# Patient Record
Sex: Male | Born: 1960 | Race: White | Hispanic: No | Smoking: Current every day smoker
Health system: Southern US, Community
[De-identification: ages and names within clinical notes are randomized; demographics above are authoritative.]

## PROBLEM LIST (undated history)

## (undated) DIAGNOSIS — F4310 Post-traumatic stress disorder, unspecified: Secondary | ICD-10-CM

## (undated) DIAGNOSIS — M542 Cervicalgia: Secondary | ICD-10-CM

## (undated) DIAGNOSIS — G43909 Migraine, unspecified, not intractable, without status migrainosus: Secondary | ICD-10-CM

## (undated) DIAGNOSIS — G8929 Other chronic pain: Secondary | ICD-10-CM

## (undated) DIAGNOSIS — K219 Gastro-esophageal reflux disease without esophagitis: Secondary | ICD-10-CM

## (undated) DIAGNOSIS — F419 Anxiety disorder, unspecified: Secondary | ICD-10-CM

## (undated) DIAGNOSIS — M545 Low back pain, unspecified: Secondary | ICD-10-CM

## (undated) DIAGNOSIS — K259 Gastric ulcer, unspecified as acute or chronic, without hemorrhage or perforation: Secondary | ICD-10-CM

## (undated) DIAGNOSIS — M1990 Unspecified osteoarthritis, unspecified site: Secondary | ICD-10-CM

## (undated) DIAGNOSIS — F329 Major depressive disorder, single episode, unspecified: Secondary | ICD-10-CM

## (undated) DIAGNOSIS — F32A Depression, unspecified: Secondary | ICD-10-CM

## (undated) HISTORY — PX: ESOPHAGOGASTRODUODENOSCOPY: SHX1529

## (undated) HISTORY — PX: JOINT REPLACEMENT: SHX530

## (undated) HISTORY — PX: COLONOSCOPY: SHX174

## (undated) HISTORY — PX: KNEE ARTHROSCOPY: SUR90

---
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
## 2000-01-06 HISTORY — PX: SHOULDER ARTHROSCOPY: SHX128

## 2000-01-06 HISTORY — PX: CLAVICLE SURGERY: SHX598

## 2005-02-03 ENCOUNTER — Encounter
Admission: RE | Admit: 2005-02-03 | Discharge: 2005-05-04 | Payer: Self-pay | Admitting: Physical Medicine & Rehabilitation

## 2005-02-03 ENCOUNTER — Ambulatory Visit: Payer: Self-pay | Admitting: Physical Medicine & Rehabilitation

## 2006-01-05 HISTORY — PX: ANKLE FUSION: SHX881

## 2010-01-05 HISTORY — PX: ANKLE HARDWARE REMOVAL: SHX1149

## 2015-11-14 ENCOUNTER — Ambulatory Visit: Payer: Self-pay | Admitting: Orthopedic Surgery

## 2015-12-11 NOTE — Pre-Procedure Instructions (Signed)
    De NurseLawrence Teigen  12/11/2015     No Pharmacies Listed   Your procedure is scheduled on December 18  [facility]  Call this number if you have problems the morning of surgery:  [phone]   Remember:  Do not eat food or drink liquids after midnight.   Take these medicines the morning of surgery with A SIP OF WATER cetirizine (ZYRTEC),gabapentin (NEURONTIN,  HYDROcodone-acetaminophen (NORCO/VICODIN,methocarbamol (ROBAXIN),  escitalopram (LEXAPRO)  7 days prior to surgery STOP taking any Aspirin, Aleve, Naproxen, Ibuprofen, Motrin, Advil, Goody's, BC's, all herbal medications, fish oil, and all vitamins    Do not wear jewelry.  Do not wear lotions, powders, or cologne, or deoderant.  Men may shave face and neck.  Do not bring valuables to the hospital.  [facility] is not [facility] for any belongings or valuables.  Contacts, dentures or bridgework may not be worn into surgery.  Leave your suitcase in the car.  After surgery it may be brought to your room.  For patients admitted to the hospital, discharge time will be determined by your treatment team.  Patients discharged the day of surgery will not be allowed to drive home.    Special instructions:   [facility]- Preparing For Surgery  Before surgery, you can play an important role. Because skin is not sterile, your skin needs to be as free of germs as possible. You can reduce the number of germs on your skin by washing with CHG (chlorahexidine gluconate) Soap before surgery.  CHG is an antiseptic cleaner which kills germs and bonds with the skin to continue killing germs even after washing.  Please do not use if you have an allergy to CHG or antibacterial soaps. If your skin becomes reddened/irritated stop using the CHG.  Do not shave (including legs and underarms) for at least 48 hours prior to first CHG shower. It is OK to shave your face.  Please follow these instructions  carefully.   1. Shower the NIGHT BEFORE SURGERY and the MORNING OF SURGERY with CHG.   2. If you chose to wash your hair, wash your hair first as usual with your normal shampoo.  3. After you shampoo, rinse your hair and body thoroughly to remove the shampoo.  4. Use CHG as you would any other liquid soap. You can apply CHG directly to the skin and wash gently with a scrungie or a clean washcloth.   5. Apply the CHG Soap to your body ONLY FROM THE NECK DOWN.  Do not use on open wounds or open sores. Avoid contact with your eyes, ears, mouth and genitals (private parts). Wash genitals (private parts) with your normal soap.  6. Wash thoroughly, paying special attention to the area where your surgery will be performed.  7. Thoroughly rinse your body with warm water from the neck down.  8. DO NOT shower/wash with your normal soap after using and rinsing off the CHG Soap.  9. Pat yourself dry with a CLEAN TOWEL.   10. Wear CLEAN PAJAMAS   11. Place CLEAN SHEETS on your bed the night of your first shower and DO NOT SLEEP WITH PETS.    Day of Surgery: Do not apply any deodorants/lotions. Please wear clean clothes to the hospital/surgery center.      Please read over the following fact sheets that you were given.

## 2015-12-12 ENCOUNTER — Encounter (HOSPITAL_COMMUNITY)
Admission: RE | Admit: 2015-12-12 | Discharge: 2015-12-12 | Disposition: A | Discharge status: Home / Self Care | Payer: No Typology Code available for payment source | Source: Ambulatory Visit | Attending: Orthopedic Surgery | Admitting: Orthopedic Surgery

## 2015-12-12 ENCOUNTER — Encounter (HOSPITAL_COMMUNITY): Payer: Self-pay

## 2015-12-12 DIAGNOSIS — Z01812 Encounter for preprocedural laboratory examination: Secondary | ICD-10-CM | POA: Insufficient documentation

## 2015-12-12 HISTORY — DX: Major depressive disorder, single episode, unspecified: F32.9

## 2015-12-12 HISTORY — DX: Anxiety disorder, unspecified: F41.9

## 2015-12-12 HISTORY — DX: Unspecified osteoarthritis, unspecified site: M19.90

## 2015-12-12 HISTORY — DX: Post-traumatic stress disorder, unspecified: F43.10

## 2015-12-12 HISTORY — DX: Depression, unspecified: F32.A

## 2015-12-12 HISTORY — DX: Gastro-esophageal reflux disease without esophagitis: K21.9

## 2015-12-12 LAB — SURGICAL PCR SCREEN
MRSA, PCR: NEGATIVE
STAPHYLOCOCCUS AUREUS: NEGATIVE

## 2015-12-12 LAB — CBC
HEMATOCRIT: 43.9 % (ref 39.0–52.0)
Hemoglobin: 15.6 g/dL (ref 13.0–17.0)
MCH: 30.7 pg (ref 26.0–34.0)
MCHC: 35.5 g/dL (ref 30.0–36.0)
MCV: 86.4 fL (ref 78.0–100.0)
PLATELETS: 185 10*3/uL (ref 150–400)
RBC: 5.08 MIL/uL (ref 4.22–5.81)
RDW: 13.2 % (ref 11.5–15.5)
WBC: 7.4 10*3/uL (ref 4.0–10.5)

## 2015-12-12 LAB — TYPE AND SCREEN
ABO/RH(D): A NEG
ANTIBODY SCREEN: NEGATIVE

## 2015-12-12 LAB — BASIC METABOLIC PANEL
Anion gap: 8 (ref 5–15)
BUN: 7 mg/dL (ref 6–20)
CO2: 24 mmol/L (ref 22–32)
CREATININE: 0.96 mg/dL (ref 0.61–1.24)
Calcium: 8.9 mg/dL (ref 8.9–10.3)
Chloride: 104 mmol/L (ref 101–111)
GFR calc Af Amer: >60 mL/min (ref >60)
GLUCOSE: 108 mg/dL — AB (ref 65–99)
POTASSIUM: 3.9 mmol/L (ref 3.5–5.1)
SODIUM: 136 mmol/L (ref 135–145)

## 2015-12-12 LAB — ABO/RH: ABO/RH(D): A NEG

## 2015-12-12 NOTE — Progress Notes (Signed)
PCP - [facility] [place]irt Bachmann [place] [facility] [facility] - denies  Chest x-ray - not needed EKG - denies Stress Test -denies  ECHO - denies Cardiac Cath - denies  No cardiac History. Requesting last office visit from PCP  will send to anesthesia for review of records from PCP    Patient denies shortness of breath, fever, cough and chest pain at PAT appointment

## 2015-12-18 ENCOUNTER — Ambulatory Visit: Payer: Self-pay | Admitting: Orthopedic Surgery

## 2015-12-18 NOTE — H&P (Signed)
TOTAL HIP ADMISSION H&P  Patient is admitted for left total hip arthroplasty.  Subjective:  Chief Complaint: left hip pain  HPI: Zachary Watson, 55 y.o. male, has a history of pain and functional disability in the left hip(s) due to arthritis and patient has failed non-surgical conservative treatments for greater than 12 weeks to include NSAID's and/or analgesics, flexibility and strengthening excercises, use of assistive devices, weight reduction as appropriate and activity modification.  Onset of symptoms was gradual starting 5 years ago with gradually worsening course since that time.The patient noted no past surgery on the left hip(s).  Patient currently rates pain in the left hip at 10 out of 10 with activity. Patient has night pain, worsening of pain with activity and weight bearing, pain that interfers with activities of daily living, pain with passive range of motion and crepitus. Patient has evidence of subchondral cysts, subchondral sclerosis, periarticular osteophytes and joint space narrowing by imaging studies. This condition presents safety issues increasing the risk of falls. There is no current active infection.  There are no active problems to display for this patient.  Past Medical History:  Diagnosis Date  . Anxiety   . Arthritis   . Chronic back pain   . Depression   . GERD (gastroesophageal reflux disease)   . PTSD (post-traumatic stress disorder)     Past Surgical History:  Procedure Laterality Date  . ANKLE SURGERY Right    fusion  . CLAVICLE SURGERY Left   . COLONOSCOPY    . ESOPHAGOGASTRODUODENOSCOPY    . KNEE ARTHROSCOPY Left   . SHOULDER ARTHROSCOPY Left   . WRIST SURGERY Right      (Not in a hospital admission) No Known Allergies  Social History  Substance Use Topics  . Smoking status: Former Smoker    Quit date: 12/02/2015  . Smokeless tobacco: Former User    Types: Chew  . Alcohol use Yes     Comment: occasionally    No family history on  file.   Review of Systems  Constitutional: Negative.   Eyes: Negative.   Respiratory: Negative.   Cardiovascular: Negative.   Gastrointestinal: Positive for blood in stool and diarrhea.  Musculoskeletal: Positive for back pain and joint pain.  Skin: Positive for itching.  Neurological: Negative.   Endo/Heme/Allergies: Negative.   Psychiatric/Behavioral: Positive for memory loss. The patient has insomnia.     Objective:  Physical Exam  Vitals reviewed. Constitutional: He is oriented to person, place, and time. He appears well-developed and well-nourished.  HENT:  Head: Normocephalic and atraumatic.  Eyes: Conjunctivae and EOM are normal. Pupils are equal, round, and reactive to light.  Neck: Normal range of motion. Neck supple.  Cardiovascular: Normal rate, regular rhythm and intact distal pulses.   Respiratory: Effort normal. No respiratory distress.  GI: Soft. He exhibits no distension.  Genitourinary:  Genitourinary Comments: deferred  Musculoskeletal:       Left hip: He exhibits decreased range of motion and bony tenderness.  Neurological: He is alert and oriented to person, place, and time. He has normal reflexes.  Skin: Skin is warm and dry.  Psychiatric: He has a normal mood and affect. His behavior is normal. Judgment and thought content normal.    Vital signs in last 24 hours: @VSRANGES@  Labs:   Estimated body mass index is 27.12 kg/m as calculated from the following:   Height as of 12/12/15: 5' 11" (1.803 m).   Weight as of 12/12/15: 88.2 kg (194 lb 7.1 oz).     Imaging Review Plain radiographs demonstrate severe degenerative joint disease of the left hip(s). The bone quality appears to be adequate for age and reported activity level.  Assessment/Plan:  End stage arthritis, left hip(s)  The patient history, physical examination, clinical judgement of the provider and imaging studies are consistent with end stage degenerative joint disease of the left  hip(s) and total hip arthroplasty is deemed medically necessary. The treatment options including medical management, injection therapy, arthroscopy and arthroplasty were discussed at length. The risks and benefits of total hip arthroplasty were presented and reviewed. The risks due to aseptic loosening, infection, stiffness, dislocation/subluxation,  thromboembolic complications and other imponderables were discussed.  The patient acknowledged the explanation, agreed to proceed with the plan and consent was signed. Patient is being admitted for inpatient treatment for surgery, pain control, PT, OT, prophylactic antibiotics, VTE prophylaxis, progressive ambulation and ADL's and discharge planning.The patient is planning to be discharged home with home health services

## 2015-12-20 MED ORDER — TRANEXAMIC ACID 1000 MG/10ML IV SOLN
1000.0000 mg | INTRAVENOUS | Status: AC
  Administered 2015-12-23: 1000 mg via INTRAVENOUS
  Filled 2015-12-20: qty 10

## 2015-12-20 MED ORDER — ACETAMINOPHEN 10 MG/ML IV SOLN
1000.0000 mg | INTRAVENOUS | Status: AC
  Administered 2015-12-23: 1000 mg via INTRAVENOUS
  Filled 2015-12-20: qty 100

## 2015-12-23 ENCOUNTER — Inpatient Hospital Stay (HOSPITAL_COMMUNITY): Payer: Non-veteran care | Admitting: Emergency Medicine

## 2015-12-23 ENCOUNTER — Inpatient Hospital Stay (HOSPITAL_COMMUNITY)
Admission: RE | Admit: 2015-12-23 | Discharge: 2015-12-24 | DRG: 470 | Disposition: A | Discharge status: Home Health | Payer: Non-veteran care | Source: Ambulatory Visit | Attending: Orthopedic Surgery | Admitting: Orthopedic Surgery

## 2015-12-23 ENCOUNTER — Encounter (HOSPITAL_COMMUNITY)
Admission: RE | Disposition: A | Discharge status: Home Health | Payer: Self-pay | Source: Ambulatory Visit | Attending: Orthopedic Surgery

## 2015-12-23 ENCOUNTER — Inpatient Hospital Stay (HOSPITAL_COMMUNITY): Payer: Non-veteran care | Admitting: Certified Registered Nurse Anesthetist

## 2015-12-23 ENCOUNTER — Inpatient Hospital Stay (HOSPITAL_COMMUNITY): Payer: Non-veteran care

## 2015-12-23 ENCOUNTER — Encounter (HOSPITAL_COMMUNITY): Payer: Self-pay | Admitting: *Deleted

## 2015-12-23 DIAGNOSIS — M1612 Unilateral primary osteoarthritis, left hip: Secondary | ICD-10-CM | POA: Diagnosis present

## 2015-12-23 DIAGNOSIS — K219 Gastro-esophageal reflux disease without esophagitis: Secondary | ICD-10-CM | POA: Diagnosis present

## 2015-12-23 DIAGNOSIS — Z09 Encounter for follow-up examination after completed treatment for conditions other than malignant neoplasm: Secondary | ICD-10-CM

## 2015-12-23 DIAGNOSIS — G4700 Insomnia, unspecified: Secondary | ICD-10-CM | POA: Diagnosis present

## 2015-12-23 DIAGNOSIS — R262 Difficulty in walking, not elsewhere classified: Secondary | ICD-10-CM

## 2015-12-23 DIAGNOSIS — F4310 Post-traumatic stress disorder, unspecified: Secondary | ICD-10-CM | POA: Diagnosis present

## 2015-12-23 DIAGNOSIS — M25552 Pain in left hip: Secondary | ICD-10-CM | POA: Diagnosis present

## 2015-12-23 DIAGNOSIS — Z87891 Personal history of nicotine dependence: Secondary | ICD-10-CM | POA: Diagnosis not present

## 2015-12-23 DIAGNOSIS — I959 Hypotension, unspecified: Secondary | ICD-10-CM | POA: Diagnosis present

## 2015-12-23 DIAGNOSIS — Z419 Encounter for procedure for purposes other than remedying health state, unspecified: Secondary | ICD-10-CM

## 2015-12-23 DIAGNOSIS — M79605 Pain in left leg: Secondary | ICD-10-CM

## 2015-12-23 HISTORY — PX: TOTAL HIP ARTHROPLASTY: SHX124

## 2015-12-23 SURGERY — ARTHROPLASTY, HIP, TOTAL, ANTERIOR APPROACH
Anesthesia: Monitor Anesthesia Care | Site: Hip | Laterality: Left

## 2015-12-23 MED ORDER — HYDROMORPHONE HCL 2 MG/ML IJ SOLN: 0.5000 mg | INTRAMUSCULAR | Status: DC | PRN

## 2015-12-23 MED ORDER — PROMETHAZINE HCL 25 MG/ML IJ SOLN: 6.2500 mg | INTRAMUSCULAR | Status: DC | PRN

## 2015-12-23 MED ORDER — DIPHENHYDRAMINE HCL 12.5 MG/5ML PO ELIX: 12.5000 mg | ORAL_SOLUTION | ORAL | Status: DC | PRN

## 2015-12-23 MED ORDER — SUGAMMADEX SODIUM 200 MG/2ML IV SOLN
INTRAVENOUS | Status: AC
  Filled 2015-12-23: qty 2

## 2015-12-23 MED ORDER — PHENOL 1.4 % MT LIQD: 1.0000 | OROMUCOSAL | Status: DC | PRN

## 2015-12-23 MED ORDER — KETOROLAC TROMETHAMINE 30 MG/ML IJ SOLN
INTRAMUSCULAR | Status: DC | PRN
  Administered 2015-12-23: 30 mg via INTRA_ARTICULAR

## 2015-12-23 MED ORDER — POVIDONE-IODINE 10 % EX SWAB: 2.0000 "application " | Freq: Once | CUTANEOUS | Status: DC

## 2015-12-23 MED ORDER — ONDANSETRON HCL 4 MG/2ML IJ SOLN: 4.0000 mg | Freq: Four times a day (QID) | INTRAMUSCULAR | Status: DC | PRN

## 2015-12-23 MED ORDER — SODIUM CHLORIDE 0.9 % IV SOLN: INTRAVENOUS | Status: DC

## 2015-12-23 MED ORDER — ROCURONIUM BROMIDE 50 MG/5ML IV SOSY
PREFILLED_SYRINGE | INTRAVENOUS | Status: AC
  Filled 2015-12-23: qty 10

## 2015-12-23 MED ORDER — CEFAZOLIN SODIUM-DEXTROSE 2-4 GM/100ML-% IV SOLN
2.0000 g | INTRAVENOUS | Status: AC
  Administered 2015-12-23: 2 g via INTRAVENOUS
  Filled 2015-12-23: qty 100

## 2015-12-23 MED ORDER — BUPIVACAINE HCL (PF) 0.5 % IJ SOLN
INTRAMUSCULAR | Status: DC | PRN
  Administered 2015-12-23: 25 mL

## 2015-12-23 MED ORDER — ONDANSETRON HCL 4 MG/2ML IJ SOLN
INTRAMUSCULAR | Status: DC | PRN
  Administered 2015-12-23: 4 mg via INTRAVENOUS

## 2015-12-23 MED ORDER — METOCLOPRAMIDE HCL 5 MG PO TABS: 5.0000 mg | ORAL_TABLET | Freq: Three times a day (TID) | ORAL | Status: DC | PRN

## 2015-12-23 MED ORDER — PHENYLEPHRINE 40 MCG/ML (10ML) SYRINGE FOR IV PUSH (FOR BLOOD PRESSURE SUPPORT)
PREFILLED_SYRINGE | INTRAVENOUS | Status: AC
  Filled 2015-12-23: qty 10

## 2015-12-23 MED ORDER — PROPOFOL 10 MG/ML IV BOLUS
INTRAVENOUS | Status: AC
  Filled 2015-12-23: qty 20

## 2015-12-23 MED ORDER — PROPOFOL 500 MG/50ML IV EMUL
INTRAVENOUS | Status: DC | PRN
  Administered 2015-12-23: 100 ug/kg/min via INTRAVENOUS

## 2015-12-23 MED ORDER — METOCLOPRAMIDE HCL 5 MG/ML IJ SOLN: 5.0000 mg | Freq: Three times a day (TID) | INTRAMUSCULAR | Status: DC | PRN

## 2015-12-23 MED ORDER — ASPIRIN 81 MG PO CHEW
81.0000 mg | CHEWABLE_TABLET | Freq: Two times a day (BID) | ORAL | Status: DC
  Administered 2015-12-23 – 2015-12-24 (×2): 81 mg via ORAL
  Filled 2015-12-23 (×2): qty 1

## 2015-12-23 MED ORDER — LACTATED RINGERS IV SOLN: INTRAVENOUS | Status: DC

## 2015-12-23 MED ORDER — LACTATED RINGERS IV SOLN
INTRAVENOUS | Status: DC | PRN
Start: 2015-12-23 — End: 2015-12-23
  Administered 2015-12-23 (×2): via INTRAVENOUS

## 2015-12-23 MED ORDER — FENTANYL CITRATE (PF) 100 MCG/2ML IJ SOLN
INTRAMUSCULAR | Status: DC | PRN
  Administered 2015-12-23 (×4): 25 ug via INTRAVENOUS

## 2015-12-23 MED ORDER — METHOCARBAMOL 1000 MG/10ML IJ SOLN
500.0000 mg | Freq: Four times a day (QID) | INTRAMUSCULAR | Status: DC | PRN
  Filled 2015-12-23: qty 5

## 2015-12-23 MED ORDER — CEFAZOLIN SODIUM-DEXTROSE 2-4 GM/100ML-% IV SOLN
2.0000 g | Freq: Four times a day (QID) | INTRAVENOUS | Status: DC
  Filled 2015-12-23 (×2): qty 100

## 2015-12-23 MED ORDER — ONDANSETRON HCL 4 MG PO TABS: 4.0000 mg | ORAL_TABLET | Freq: Four times a day (QID) | ORAL | Status: DC | PRN

## 2015-12-23 MED ORDER — ONDANSETRON HCL 4 MG/2ML IJ SOLN
INTRAMUSCULAR | Status: AC
  Filled 2015-12-23: qty 2

## 2015-12-23 MED ORDER — SODIUM CHLORIDE 0.9 % IV SOLN
1000.0000 mg | Freq: Once | INTRAVENOUS | Status: DC
  Filled 2015-12-23 (×2): qty 10

## 2015-12-23 MED ORDER — METHOCARBAMOL 500 MG PO TABS
500.0000 mg | ORAL_TABLET | Freq: Four times a day (QID) | ORAL | Status: DC | PRN
  Administered 2015-12-24 (×2): 500 mg via ORAL
  Filled 2015-12-23 (×2): qty 1

## 2015-12-23 MED ORDER — HYDROCODONE-ACETAMINOPHEN 7.5-325 MG PO TABS
1.0000 | ORAL_TABLET | ORAL | Status: DC | PRN
  Administered 2015-12-23 – 2015-12-24 (×3): 2 via ORAL
  Filled 2015-12-23 (×3): qty 2

## 2015-12-23 MED ORDER — DOCUSATE SODIUM 100 MG PO CAPS
100.0000 mg | ORAL_CAPSULE | Freq: Two times a day (BID) | ORAL | Status: DC
  Administered 2015-12-23 – 2015-12-24 (×2): 100 mg via ORAL
  Filled 2015-12-23 (×2): qty 1

## 2015-12-23 MED ORDER — SODIUM CHLORIDE 0.9 % IJ SOLN
INTRAMUSCULAR | Status: DC | PRN
  Administered 2015-12-23: 25 mL

## 2015-12-23 MED ORDER — PROPOFOL 10 MG/ML IV BOLUS
INTRAVENOUS | Status: DC | PRN
  Administered 2015-12-23: 30 mg via INTRAVENOUS
  Administered 2015-12-23: 20 mg via INTRAVENOUS

## 2015-12-23 MED ORDER — FENTANYL CITRATE (PF) 100 MCG/2ML IJ SOLN
INTRAMUSCULAR | Status: AC
  Filled 2015-12-23: qty 2

## 2015-12-23 MED ORDER — BUPIVACAINE HCL (PF) 0.5 % IJ SOLN
INTRAMUSCULAR | Status: AC
  Filled 2015-12-23: qty 30

## 2015-12-23 MED ORDER — KETOROLAC TROMETHAMINE 30 MG/ML IJ SOLN
INTRAMUSCULAR | Status: AC
  Filled 2015-12-23: qty 1

## 2015-12-23 MED ORDER — POLYETHYLENE GLYCOL 3350 17 G PO PACK: 17.0000 g | PACK | Freq: Every day | ORAL | Status: DC | PRN

## 2015-12-23 MED ORDER — EPHEDRINE 5 MG/ML INJ
INTRAVENOUS | Status: AC
  Filled 2015-12-23: qty 10

## 2015-12-23 MED ORDER — 0.9 % SODIUM CHLORIDE (POUR BTL) OPTIME
TOPICAL | Status: DC | PRN
  Administered 2015-12-23: 1000 mL

## 2015-12-23 MED ORDER — PROPOFOL 1000 MG/100ML IV EMUL
INTRAVENOUS | Status: AC
  Filled 2015-12-23: qty 200

## 2015-12-23 MED ORDER — ACETAMINOPHEN 325 MG PO TABS: 650.0000 mg | ORAL_TABLET | Freq: Four times a day (QID) | ORAL | Status: DC | PRN

## 2015-12-23 MED ORDER — ALUM & MAG HYDROXIDE-SIMETH 200-200-20 MG/5ML PO SUSP: 30.0000 mL | ORAL | Status: DC | PRN

## 2015-12-23 MED ORDER — GABAPENTIN 300 MG PO CAPS
300.0000 mg | ORAL_CAPSULE | Freq: Two times a day (BID) | ORAL | Status: DC
  Administered 2015-12-23 – 2015-12-24 (×2): 300 mg via ORAL
  Filled 2015-12-23 (×2): qty 1

## 2015-12-23 MED ORDER — SODIUM CHLORIDE 0.9 % IV SOLN
INTRAVENOUS | Status: DC
  Administered 2015-12-24: 01:00:00 via INTRAVENOUS

## 2015-12-23 MED ORDER — LIDOCAINE 2% (20 MG/ML) 5 ML SYRINGE
INTRAMUSCULAR | Status: AC
  Filled 2015-12-23: qty 5

## 2015-12-23 MED ORDER — MEPERIDINE HCL 25 MG/ML IJ SOLN: 6.2500 mg | INTRAMUSCULAR | Status: DC | PRN

## 2015-12-23 MED ORDER — ROCURONIUM BROMIDE 50 MG/5ML IV SOSY
PREFILLED_SYRINGE | INTRAVENOUS | Status: AC
  Filled 2015-12-23: qty 5

## 2015-12-23 MED ORDER — KETOROLAC TROMETHAMINE 15 MG/ML IJ SOLN
15.0000 mg | Freq: Four times a day (QID) | INTRAMUSCULAR | Status: DC
  Administered 2015-12-23 – 2015-12-24 (×3): 15 mg via INTRAVENOUS
  Filled 2015-12-23 (×4): qty 1

## 2015-12-23 MED ORDER — LORATADINE 10 MG PO TABS
10.0000 mg | ORAL_TABLET | Freq: Every day | ORAL | Status: DC
  Administered 2015-12-24: 10 mg via ORAL
  Filled 2015-12-23: qty 1

## 2015-12-23 MED ORDER — CHLORHEXIDINE GLUCONATE 4 % EX LIQD: 60.0000 mL | Freq: Once | CUTANEOUS | Status: DC

## 2015-12-23 MED ORDER — MIDAZOLAM HCL 5 MG/5ML IJ SOLN
INTRAMUSCULAR | Status: DC | PRN
  Administered 2015-12-23: 2 mg via INTRAVENOUS

## 2015-12-23 MED ORDER — MIDAZOLAM HCL 2 MG/2ML IJ SOLN
INTRAMUSCULAR | Status: AC
  Filled 2015-12-23: qty 2

## 2015-12-23 MED ORDER — ACETAMINOPHEN 650 MG RE SUPP: 650.0000 mg | Freq: Four times a day (QID) | RECTAL | Status: DC | PRN

## 2015-12-23 MED ORDER — MENTHOL 3 MG MT LOZG: 1.0000 | LOZENGE | OROMUCOSAL | Status: DC | PRN

## 2015-12-23 MED ORDER — PHENYLEPHRINE 40 MCG/ML (10ML) SYRINGE FOR IV PUSH (FOR BLOOD PRESSURE SUPPORT)
PREFILLED_SYRINGE | INTRAVENOUS | Status: AC
  Filled 2015-12-23: qty 20

## 2015-12-23 MED ORDER — DEXAMETHASONE SODIUM PHOSPHATE 10 MG/ML IJ SOLN
INTRAMUSCULAR | Status: DC | PRN
  Administered 2015-12-23: 10 mg via INTRAVENOUS

## 2015-12-23 MED ORDER — CEFAZOLIN SODIUM-DEXTROSE 2-4 GM/100ML-% IV SOLN
2.0000 g | Freq: Four times a day (QID) | INTRAVENOUS | Status: AC
  Administered 2015-12-23: 2 g via INTRAVENOUS
  Filled 2015-12-23: qty 100

## 2015-12-23 MED ORDER — SODIUM CHLORIDE 0.9 % IR SOLN
Status: DC | PRN
  Administered 2015-12-23: 3000 mL

## 2015-12-23 MED ORDER — PHENYLEPHRINE HCL 10 MG/ML IJ SOLN
INTRAVENOUS | Status: DC | PRN
  Administered 2015-12-23: 20 ug/min via INTRAVENOUS

## 2015-12-23 MED ORDER — HYDROMORPHONE HCL 1 MG/ML IJ SOLN: 0.2500 mg | INTRAMUSCULAR | Status: DC | PRN

## 2015-12-23 MED ORDER — SENNA 8.6 MG PO TABS
2.0000 | ORAL_TABLET | Freq: Every day | ORAL | Status: DC
  Administered 2015-12-23: 17.2 mg via ORAL
  Filled 2015-12-23: qty 2

## 2015-12-23 MED ORDER — PHENYLEPHRINE 40 MCG/ML (10ML) SYRINGE FOR IV PUSH (FOR BLOOD PRESSURE SUPPORT)
PREFILLED_SYRINGE | INTRAVENOUS | Status: DC | PRN
  Administered 2015-12-23 (×2): 40 ug via INTRAVENOUS
  Administered 2015-12-23: 80 ug via INTRAVENOUS

## 2015-12-23 MED ORDER — DEXAMETHASONE SODIUM PHOSPHATE 10 MG/ML IJ SOLN
10.0000 mg | Freq: Once | INTRAMUSCULAR | Status: AC
  Administered 2015-12-24: 10 mg via INTRAVENOUS
  Filled 2015-12-23: qty 1

## 2015-12-23 MED ORDER — ESCITALOPRAM OXALATE 10 MG PO TABS
20.0000 mg | ORAL_TABLET | Freq: Every day | ORAL | Status: DC
  Administered 2015-12-24: 20 mg via ORAL
  Filled 2015-12-23: qty 2

## 2015-12-23 SURGICAL SUPPLY — 57 items
ADH SKN CLS APL DERMABOND .7 (GAUZE/BANDAGES/DRESSINGS) ×2
ALCOHOL ISOPROPYL (RUBBING) (MISCELLANEOUS) ×3 IMPLANT
BLADE SURG ROTATE 9660 (MISCELLANEOUS) ×2 IMPLANT
CAPT HIP TOTAL 2 ×2 IMPLANT
CHLORAPREP W/TINT 26ML (MISCELLANEOUS) ×3 IMPLANT
COVER SURGICAL LIGHT HANDLE (MISCELLANEOUS) ×3 IMPLANT
DECANTER SPIKE VIAL GLASS SM (MISCELLANEOUS) ×2 IMPLANT
DERMABOND ADVANCED (GAUZE/BANDAGES/DRESSINGS) ×4
DERMABOND ADVANCED .7 DNX12 (GAUZE/BANDAGES/DRESSINGS) ×2 IMPLANT
DRAPE C-ARM 42X72 X-RAY (DRAPES) ×3 IMPLANT
DRAPE IMP U-DRAPE 54X76 (DRAPES) ×6 IMPLANT
DRAPE STERI IOBAN 125X83 (DRAPES) ×3 IMPLANT
DRAPE U-SHAPE 47X51 STRL (DRAPES) ×9 IMPLANT
DRSG AQUACEL AG ADV 3.5X10 (GAUZE/BANDAGES/DRESSINGS) ×3 IMPLANT
ELECT BLADE 4.0 EZ CLEAN MEGAD (MISCELLANEOUS) ×3
ELECT REM PT RETURN 9FT ADLT (ELECTROSURGICAL) ×3
ELECTRODE BLDE 4.0 EZ CLN MEGD (MISCELLANEOUS) ×1 IMPLANT
ELECTRODE REM PT RTRN 9FT ADLT (ELECTROSURGICAL) ×1 IMPLANT
EVACUATOR 1/8 PVC DRAIN (DRAIN) IMPLANT
GLOVE BIO SURGEON STRL SZ8.5 (GLOVE) ×6 IMPLANT
GLOVE BIOGEL PI IND STRL 8.5 (GLOVE) ×1 IMPLANT
GLOVE BIOGEL PI INDICATOR 8.5 (GLOVE) ×2
GOWN STRL REUS W/ TWL LRG LVL3 (GOWN DISPOSABLE) ×2 IMPLANT
GOWN STRL REUS W/TWL 2XL LVL3 (GOWN DISPOSABLE) ×3 IMPLANT
GOWN STRL REUS W/TWL LRG LVL3 (GOWN DISPOSABLE) ×6
HANDPIECE INTERPULSE COAX TIP (DISPOSABLE) ×3
HOOD PEEL AWAY FACE SHEILD DIS (HOOD) ×6 IMPLANT
KIT BASIN OR (CUSTOM PROCEDURE TRAY) ×3 IMPLANT
KIT ROOM TURNOVER OR (KITS) ×3 IMPLANT
MANIFOLD NEPTUNE II (INSTRUMENTS) ×3 IMPLANT
MARKER SKIN DUAL TIP RULER LAB (MISCELLANEOUS) ×6 IMPLANT
NEEDLE SPNL 18GX3.5 QUINCKE PK (NEEDLE) ×3 IMPLANT
NS IRRIG 1000ML POUR BTL (IV SOLUTION) ×3 IMPLANT
PACK TOTAL JOINT (CUSTOM PROCEDURE TRAY) ×3 IMPLANT
PACK UNIVERSAL I (CUSTOM PROCEDURE TRAY) ×3 IMPLANT
PAD ARMBOARD 7.5X6 YLW CONV (MISCELLANEOUS) ×6 IMPLANT
SAW OSC TIP CART 19.5X105X1.3 (SAW) ×3 IMPLANT
SEALER BIPOLAR AQUA 6.0 (INSTRUMENTS) ×2 IMPLANT
SET HNDPC FAN SPRY TIP SCT (DISPOSABLE) ×1 IMPLANT
SOLUTION BETADINE 4OZ (MISCELLANEOUS) ×3 IMPLANT
SUCTION FRAZIER HANDLE 10FR (MISCELLANEOUS) ×2
SUCTION TUBE FRAZIER 10FR DISP (MISCELLANEOUS) ×1 IMPLANT
SUT ETHIBOND NAB CT1 #1 30IN (SUTURE) ×6 IMPLANT
SUT MNCRL AB 3-0 PS2 18 (SUTURE) ×3 IMPLANT
SUT MON AB 2-0 CT1 36 (SUTURE) ×3 IMPLANT
SUT VIC AB 1 CT1 27 (SUTURE) ×3
SUT VIC AB 1 CT1 27XBRD ANBCTR (SUTURE) ×1 IMPLANT
SUT VIC AB 2-0 CT1 27 (SUTURE) ×3
SUT VIC AB 2-0 CT1 TAPERPNT 27 (SUTURE) ×1 IMPLANT
SUT VLOC 180 0 24IN GS25 (SUTURE) ×3 IMPLANT
SYR 50ML LL SCALE MARK (SYRINGE) ×3 IMPLANT
TOWEL OR 17X24 6PK STRL BLUE (TOWEL DISPOSABLE) ×3 IMPLANT
TOWEL OR 17X26 10 PK STRL BLUE (TOWEL DISPOSABLE) ×3 IMPLANT
TRAY CATH 16FR W/PLASTIC CATH (SET/KITS/TRAYS/PACK) ×2 IMPLANT
TRAY FOLEY CATH 16FR SILVER (SET/KITS/TRAYS/PACK) IMPLANT
WATER STERILE IRR 1000ML POUR (IV SOLUTION) ×3 IMPLANT
YANKAUER SUCT BULB TIP NO VENT (SUCTIONS) ×3 IMPLANT

## 2015-12-23 NOTE — Progress Notes (Signed)
Report given to lauren reynolds rn as caregiver 

## 2015-12-23 NOTE — Anesthesia Postprocedure Evaluation (Signed)
Anesthesia Post Note  Patient: Zachary Watson  Procedure(s) Performed: Procedure(s) (LRB): LEFT TOTAL HIP ARTHROPLASTY ANTERIOR APPROACH (Left)  Patient location during evaluation: PACU Anesthesia Type: Spinal and MAC Level of consciousness: awake and alert Pain management: pain level controlled Vital Signs Assessment: post-procedure vital signs reviewed and stable Respiratory status: spontaneous breathing and respiratory function stable Cardiovascular status: blood pressure returned to baseline and stable Postop Assessment: spinal receding Anesthetic complications: no       Last Vitals:  Vitals:   12/23/15 1600 12/23/15 1615  BP: 92/69 90/60  Pulse: 61 (!) 53  Resp: 19 19  Temp:      Last Pain:  Vitals:   12/23/15 1600  TempSrc:   PainSc: 0-No pain                 Lewie LoronJohn Oather [place]

## 2015-12-23 NOTE — Discharge Instructions (Signed)
°Dr. Ryan Palermo °Joint Replacement Specialist °[facility] °[address] °[place], [place] [address] °[phone] ° ° °TOTAL HIP REPLACEMENT POSTOPERATIVE DIRECTIONS ° ° ° °Hip Rehabilitation, Guidelines Following Surgery  ° °WEIGHT BEARING °Weight bearing as tolerated with assist device (walker, cane, etc) as directed, use it as long as suggested by your surgeon or therapist, typically at least 4-6 weeks. ° °The results of a hip operation are greatly improved after range of motion and muscle strengthening exercises. Follow all safety measures which are given to protect your hip. If any of these exercises cause increased pain or swelling in your joint, decrease the amount until you are comfortable again. Then slowly increase the exercises. Call your caregiver if you have problems or questions.  ° °HOME CARE INSTRUCTIONS  °Most of the following instructions are designed to prevent the dislocation of your new hip.  °Remove items at home which could result in a fall. This includes throw rugs or furniture in walking pathways.  °Continue medications as instructed at time of discharge. °· You may have some home medications which will be placed on hold until you complete the course of blood thinner medication. °· You may start showering once you are discharged home. Do not remove your dressing. °Do not put on socks or shoes without following the instructions of your caregivers.   °Sit on chairs with arms. Use the chair arms to help push yourself up when arising.  °Arrange for the use of a toilet seat elevator so you are not sitting low.  °· Walk with walker as instructed.  °You may resume a sexual relationship in one month or when given the OK by your caregiver.  °Use walker as long as suggested by your caregivers.  °You may put full weight on your legs and walk as much as is comfortable. °Avoid periods of inactivity such as sitting longer than an hour when not asleep. This helps prevent  blood clots.  °You may return to work once you are cleared by your surgeon.  °Do not drive a car for 6 weeks or until released by your surgeon.  °Do not drive while taking narcotics.  °Wear elastic stockings for two weeks following surgery during the day but you may remove then at night.  °Make sure you keep all of your appointments after your operation with all of your doctors and caregivers. You should call the office at the above phone number and make an appointment for approximately two weeks after the date of your surgery. °Please pick up a stool softener and laxative for home use as long as you are requiring pain medications. °· ICE to the affected hip every three hours for 30 minutes at a time and then as needed for pain and swelling. Continue to use ice on the hip for pain and swelling from surgery. You may notice swelling that will progress down to the foot and ankle.  This is normal after surgery.  Elevate the leg when you are not up walking on it.   °It is important for you to complete the blood thinner medication as prescribed by your doctor. °· Continue to use the breathing machine which will help keep your temperature down.  It is common for your temperature to cycle up and down following surgery, especially at night when you are not up moving around and exerting yourself.  The breathing machine keeps your lungs expanded and your temperature down. ° °RANGE OF MOTION AND STRENGTHENING EXERCISES  °These exercises are   designed to help you keep full movement of your hip joint. Follow your caregiver's or physical therapist's instructions. Perform all exercises about fifteen times, three times per day or as directed. Exercise both hips, even if you have had only one joint replacement. These exercises can be done on a training (exercise) mat, on the floor, on a table or on a bed. Use whatever works the best and is most comfortable for you. Use music or television while you are exercising so that the exercises  are a pleasant break in your day. This will make your life better with the exercises acting as a break in routine you can look forward to.  °Lying on your back, slowly slide your foot toward your buttocks, raising your knee up off the floor. Then slowly slide your foot back down until your leg is straight again.  °Lying on your back spread your legs as far apart as you can without causing discomfort.  °Lying on your side, raise your upper leg and foot straight up from the floor as far as is comfortable. Slowly lower the leg and repeat.  °Lying on your back, tighten up the muscle in the front of your thigh (quadriceps muscles). You can do this by keeping your leg straight and trying to raise your heel off the floor. This helps strengthen the largest muscle supporting your knee.  °Lying on your back, tighten up the muscles of your buttocks both with the legs straight and with the knee bent at a comfortable angle while keeping your heel on the floor.  ° °SKILLED REHAB INSTRUCTIONS: °If the patient is transferred to a skilled rehab facility following release from the hospital, a list of the current medications will be sent to the facility for the patient to continue.  When discharged from the skilled rehab facility, please have the facility set up the patient's Home Health Physical Therapy prior to being released. Also, the skilled facility will be responsible for providing the patient with their medications at time of release from the facility to include their pain medication and their blood thinner medication. If the patient is still at the rehab facility at time of the two week follow up appointment, the skilled rehab facility will also need to assist the patient in arranging follow up appointment in our office and any transportation needs. ° °MAKE SURE YOU:  °Understand these instructions.  °Will watch your condition.  °Will get help right away if you are not doing well or get worse. ° °Pick up stool softner and  laxative for home use following surgery while on pain medications. °Do not remove your dressing. °The dressing is waterproof--it is OK to take showers. °Continue to use ice for pain and swelling after surgery. °Do not use any lotions or creams on the incision until instructed by your surgeon. °Total Hip Protocol. ° ° °

## 2015-12-23 NOTE — Transfer of Care (Signed)
Immediate Anesthesia Transfer of Care Note  Patient: Zachary Watson  Procedure(s) Performed: Procedure(s): LEFT TOTAL HIP ARTHROPLASTY ANTERIOR APPROACH (Left)  Patient Location: PACU  Anesthesia Type:MAC and Spinal  Level of Consciousness: awake, alert  and oriented  Airway & Oxygen Therapy: Patient Spontanous Breathing and Patient connected to face mask oxygen  Post-op Assessment: Report given to RN and Post -op Vital signs reviewed and stable  Post vital signs: Reviewed and stable  Last Vitals:  Vitals:   12/23/15 1003  BP: 110/76  Pulse: 73  Resp: 18  Temp: 36.5 C    Last Pain:  Vitals:   12/23/15 1003  TempSrc: Oral         Complications: No apparent anesthesia complications

## 2015-12-23 NOTE — OR Nursing (Signed)
1445: in&out cath=300cc cyu per protocol, no trauma

## 2015-12-23 NOTE — Interval H&P Note (Signed)
History and Physical Interval Note:  12/23/2015 11:22 AM  [name]  has presented today for surgery, with the diagnosis of DJD LEFT HIP  The various methods of treatment have been discussed with the patient and family. After consideration of risks, benefits and other options for treatment, the patient has consented to  Procedure(s): LEFT TOTAL HIP ARTHROPLASTY ANTERIOR APPROACH (Left) as a surgical intervention .  The patient's history has been reviewed, patient examined, no change in status, stable for surgery.  I have reviewed the patient's chart and labs.  Questions were answered to the patient's satisfaction.     Chakira Jachim, Cloyde ReamsBrian James

## 2015-12-23 NOTE — Op Note (Signed)
OPERATIVE REPORT  SURGEON: Samson FredericBrian Kiyonna Tortorelli, MD   ASSISTANT: Hart CarwinJustin Queen, RNFA.  PREOPERATIVE DIAGNOSIS: Left hip arthritis.   POSTOPERATIVE DIAGNOSIS: Left hip arthritis.   PROCEDURE: Left total hip arthroplasty, anterior approach.   IMPLANTS: DePuy Tri Lock stem, size 6, hi offset. DePuy Pinnacle Cup, size 58 mm. DePuy Altrx liner, size 36 by 58 mm, +4 neutral. DePuy Biolox ceramic head ball, size 36 + 1.5 mm.  ANESTHESIA:  Spinal  ESTIMATED BLOOD LOSS: 350 mL.  ANTIBIOTICS: 2 g Ancef.  DRAINS: None.  COMPLICATIONS: None.   CONDITION: PACU - hemodynamically stable.   BRIEF CLINICAL NOTE: Zachary Watson is a 55 y.o. male with a long-standing history of Left hip arthritis. After failing conservative management, the patient was indicated for total hip arthroplasty. The risks, benefits, and alternatives to the procedure were explained, and the patient elected to proceed.  PROCEDURE IN DETAIL: Surgical site was marked by myself. Spinal anesthesia was obtained in the pre-op holding area. Once inside the operative room, a foley catheter was inserted. The patient was then positioned on the Hana table. All bony prominences were well padded. The hip was prepped and draped in the normal sterile surgical fashion. A time-out was called verifying side and site of surgery. The patient received IV antibiotics within 60 minutes of beginning the procedure.  The direct anterior approach to the hip was performed through the Hueter interval. Lateral femoral circumflex vessels were treated with the Auqumantys. The anterior capsule was exposed and an inverted T capsulotomy was made.The femoral neck cut was made to the level of the templated cut. A corkscrew was placed into the head and the head was removed. The femoral head was found to have eburnated bone. The head was passed to the back table and was measured.  Acetabular exposure was achieved, and the pulvinar and labrum were  excised. Sequental reaming of the acetabulum was then performed up to a size 57 mm reamer. A 58 mm cup was then opened and impacted into place at approximately 40 degrees of abduction and 20 degrees of anteversion. The final polyethylene liner was impacted into place and acetabular osteophytes were removed.   I then gained femoral exposure taking care to protect the abductors and greater trochanter. This was performed using standard external rotation, extension, and adduction. The capsule was peeled off the inner aspect of the greater trochanter, taking care to preserve the short external rotators. A cookie cutter was used to enter the femoral canal, and then the femoral canal finder was placed. Sequential broaching was performed up to a size 6. Calcar planer was used on the femoral neck remnant. I placed a hi offset neck and a trial head ball. The hip was reduced. Leg lengths and offset were checked fluoroscopically. The hip was dislocated and trial components were removed. The final implants were placed, and the hip was reduced.  Fluoroscopy was used to confirm component position and leg lengths. At 90 degrees of external rotation and full extension, the hip was stable to an anterior directed force.  The wound was copiously irrigated with a dilute betadine solution followed by normal saline. Marcaine solution was injected into the periarticular soft tissue. The wound was closed in layers using #1 Vicryl and V-Loc for the fascia, 2-0 Vicryl for the subcutaneous fat, 2-0 Monocryl for the deep dermal layer, 3-0 running Monocryl subcuticular stitch, and Dermabond for the skin. Once the glue was fully dried, an Aquacell Ag dressing was applied. The patient was transported to the  recovery room in stable condition. Sponge, needle, and instrument counts were correct at the end of the case x2. The patient tolerated the procedure well and there were no known complications.

## 2015-12-23 NOTE — Anesthesia Preprocedure Evaluation (Signed)
Anesthesia Evaluation  Patient identified by MRN, date of birth, ID band Patient awake    Reviewed: Allergy & Precautions, NPO status , Patient's Chart, lab work & pertinent test results  Airway Mallampati: II  TM Distance: >3 FB Neck ROM: Full    Dental no notable dental hx.    Pulmonary neg pulmonary ROS, former smoker,    Pulmonary exam normal breath sounds clear to auscultation       Cardiovascular negative cardio ROS Normal cardiovascular exam Rhythm:Regular Rate:Normal     Neuro/Psych negative neurological ROS  negative psych ROS   GI/Hepatic Neg liver ROS, GERD  ,  Endo/Other  negative endocrine ROS  Renal/GU negative Renal ROS     Musculoskeletal negative musculoskeletal ROS (+) Arthritis ,   Abdominal   Peds  Hematology negative hematology ROS (+)   Anesthesia Other Findings   Reproductive/Obstetrics negative OB ROS                             Anesthesia Physical Anesthesia Plan  ASA: II  Anesthesia Plan: Spinal   Post-op Pain Management:    Induction: Intravenous  Airway Management Planned:   Additional Equipment:   Intra-op Plan:   Post-operative Plan:   Informed Consent: I have reviewed the patients History and Physical, chart, labs and discussed the procedure including the risks, benefits and alternatives for the proposed anesthesia with the patient or authorized representative who has indicated his/her understanding and acceptance.   Dental advisory given  Plan Discussed with: CRNA  Anesthesia Plan Comments:         Anesthesia Quick Evaluation

## 2015-12-23 NOTE — H&P (View-Only) (Signed)
TOTAL HIP ADMISSION H&P  Patient is admitted for left total hip arthroplasty.  Subjective:  Chief Complaint: left hip pain  HPI: Zachary Watson, 55 y.o. male, has a history of pain and functional disability in the left hip(s) due to arthritis and patient has failed non-surgical conservative treatments for greater than 12 weeks to include NSAID's and/or analgesics, flexibility and strengthening excercises, use of assistive devices, weight reduction as appropriate and activity modification.  Onset of symptoms was gradual starting 5 years ago with gradually worsening course since that time.The patient noted no past surgery on the left hip(s).  Patient currently rates pain in the left hip at 10 out of 10 with activity. Patient has night pain, worsening of pain with activity and weight bearing, pain that interfers with activities of daily living, pain with passive range of motion and crepitus. Patient has evidence of subchondral cysts, subchondral sclerosis, periarticular osteophytes and joint space narrowing by imaging studies. This condition presents safety issues increasing the risk of falls. There is no current active infection.  There are no active problems to display for this patient.  Past Medical History:  Diagnosis Date  . Anxiety   . Arthritis   . Chronic back pain   . Depression   . GERD (gastroesophageal reflux disease)   . PTSD (post-traumatic stress disorder)     Past Surgical History:  Procedure Laterality Date  . ANKLE SURGERY Right    fusion  . CLAVICLE SURGERY Left   . COLONOSCOPY    . ESOPHAGOGASTRODUODENOSCOPY    . KNEE ARTHROSCOPY Left   . SHOULDER ARTHROSCOPY Left   . WRIST SURGERY Right      (Not in a hospital admission) No Known Allergies  Social History  Substance Use Topics  . Smoking status: Former Smoker    Quit date: 12/02/2015  . Smokeless tobacco: Former NeurosurgeonUser    Types: Chew  . Alcohol use Yes     Comment: occasionally    No family history on  file.   Review of Systems  Constitutional: Negative.   Eyes: Negative.   Respiratory: Negative.   Cardiovascular: Negative.   Gastrointestinal: Positive for blood in stool and diarrhea.  Musculoskeletal: Positive for back pain and joint pain.  Skin: Positive for itching.  Neurological: Negative.   Endo/Heme/Allergies: Negative.   Psychiatric/Behavioral: Positive for memory loss. The patient has insomnia.     Objective:  Physical Exam  Vitals reviewed. Constitutional: He is oriented to person, place, and time. He appears well-developed and well-nourished.  HENT:  Head: Normocephalic and atraumatic.  Eyes: Conjunctivae and EOM are normal. Pupils are equal, round, and reactive to light.  Neck: Normal range of motion. Neck supple.  Cardiovascular: Normal rate, regular rhythm and intact distal pulses.   Respiratory: Effort normal. No respiratory distress.  GI: Soft. He exhibits no distension.  Genitourinary:  Genitourinary Comments: deferred  Musculoskeletal:       Left hip: He exhibits decreased range of motion and bony tenderness.  Neurological: He is alert and oriented to person, place, and time. He has normal reflexes.  Skin: Skin is warm and dry.  Psychiatric: He has a normal mood and affect. His behavior is normal. Judgment and thought content normal.    Vital signs in last 24 hours: @VSRANGES @  Labs:   Estimated body mass index is 27.12 kg/m as calculated from the following:   Height as of 12/12/15: 5\' 11"  (1.803 m).   Weight as of 12/12/15: 88.2 kg (194 lb 7.1 oz).  Imaging Review Plain radiographs demonstrate severe degenerative joint disease of the left hip(s). The bone quality appears to be adequate for age and reported activity level.  Assessment/Plan:  End stage arthritis, left hip(s)  The patient history, physical examination, clinical judgement of the provider and imaging studies are consistent with end stage degenerative joint disease of the left  hip(s) and total hip arthroplasty is deemed medically necessary. The treatment options including medical management, injection therapy, arthroscopy and arthroplasty were discussed at length. The risks and benefits of total hip arthroplasty were presented and reviewed. The risks due to aseptic loosening, infection, stiffness, dislocation/subluxation,  thromboembolic complications and other imponderables were discussed.  The patient acknowledged the explanation, agreed to proceed with the plan and consent was signed. Patient is being admitted for inpatient treatment for surgery, pain control, PT, OT, prophylactic antibiotics, VTE prophylaxis, progressive ambulation and ADL's and discharge planning.The patient is planning to be discharged home with home health services

## 2015-12-24 ENCOUNTER — Encounter (HOSPITAL_COMMUNITY): Payer: Self-pay | Admitting: Orthopedic Surgery

## 2015-12-24 LAB — CBC
HEMATOCRIT: 32.6 % — AB (ref 39.0–52.0)
Hemoglobin: 11.5 g/dL — ABNORMAL LOW (ref 13.0–17.0)
MCH: 30.5 pg (ref 26.0–34.0)
MCHC: 35.3 g/dL (ref 30.0–36.0)
MCV: 86.5 fL (ref 78.0–100.0)
Platelets: 173 10*3/uL (ref 150–400)
RBC: 3.77 MIL/uL — ABNORMAL LOW (ref 4.22–5.81)
RDW: 13.4 % (ref 11.5–15.5)
WBC: 12 10*3/uL — AB (ref 4.0–10.5)

## 2015-12-24 LAB — BASIC METABOLIC PANEL
ANION GAP: 6 (ref 5–15)
BUN: 9 mg/dL (ref 6–20)
CALCIUM: 8.1 mg/dL — AB (ref 8.9–10.3)
CO2: 25 mmol/L (ref 22–32)
Chloride: 105 mmol/L (ref 101–111)
Creatinine, Ser: 1.08 mg/dL (ref 0.61–1.24)
GFR calc Af Amer: >60 mL/min (ref >60)
GFR calc non Af Amer: >60 mL/min (ref >60)
GLUCOSE: 147 mg/dL — AB (ref 65–99)
Potassium: 4.1 mmol/L (ref 3.5–5.1)
Sodium: 136 mmol/L (ref 135–145)

## 2015-12-24 MED ORDER — SODIUM CHLORIDE 0.9 % IV BOLUS (SEPSIS)
1000.0000 mL | Freq: Once | INTRAVENOUS | Status: AC
  Administered 2015-12-24: 1000 mL via INTRAVENOUS

## 2015-12-24 MED ORDER — HYDROCODONE-ACETAMINOPHEN 7.5-325 MG PO TABS: 1.0000 | ORAL_TABLET | ORAL | 0 refills | Status: DC | PRN

## 2015-12-24 MED ORDER — DOCUSATE SODIUM 100 MG PO CAPS: 100.0000 mg | ORAL_CAPSULE | Freq: Two times a day (BID) | ORAL | 0 refills | Status: DC

## 2015-12-24 MED ORDER — ONDANSETRON HCL 4 MG PO TABS: 4.0000 mg | ORAL_TABLET | Freq: Four times a day (QID) | ORAL | 0 refills | Status: DC | PRN

## 2015-12-24 MED ORDER — SENNA 8.6 MG PO TABS: 2.0000 | ORAL_TABLET | Freq: Every day | ORAL | 0 refills | Status: DC

## 2015-12-24 MED ORDER — ASPIRIN 81 MG PO CHEW: 81.0000 mg | CHEWABLE_TABLET | Freq: Two times a day (BID) | ORAL | 1 refills | Status: DC

## 2015-12-24 NOTE — Progress Notes (Signed)
Patient verbalized understanding of discharge instructions. IV removed. Patient provided with prescriptions. Patient has 3 in 1 and walker at bedside to take home. Patient to be taken downstairs in wheelchair. Girlfriend at bedside to take patient home in car.

## 2015-12-24 NOTE — Evaluation (Signed)
Occupational Therapy Evaluation and Discharge Patient Details Name: Zachary Watson MRN: [id number] DOB: 07/10/1960 Today's Date: 12/24/2015    History of Present Illness Pt is a 55 y/o male presenting post-op Left direct anterior hip replacement. Pt has a past medical history of Anxiety; Arthritis; Chronic back pain; Depression; GERD; and PTSD. Pt has a past surgical history that includes Wrist surgery (Right); Ankle surgery (Right); Shoulder arthroscopy (Left); Clavicle surgery (Left); Knee arthroscopy (Left).   Clinical Impression   PTA Pt independent in ADL, girlfriend performing most of IADL, and using a SPC for ambulation. Pt currently mod assist for LB ADL, and min guard for ambulation with RW. Pt with history of chronic back pain, so in addition to hip compensation and safety information also applied back education where applicable. Please see performance level below. Pt fully assessed and provided with education. Pt with no further questions or concerns for OT. OT to sign off at this time. Thank you for this referral.    Follow Up Recommendations  No OT follow up;Supervision - Intermittent    Equipment Recommendations  3 in 1 bedside commode;Other (comment) (RW (Pt requesting Rollator))    Recommendations for Other Services       Precautions / Restrictions Precautions Precautions: Fall Precaution Comments: direct anterior, no precautions Restrictions Weight Bearing Restrictions: Yes LLE Weight Bearing: Weight bearing as tolerated      Mobility Bed Mobility Overal bed mobility: Needs Assistance Bed Mobility: Rolling;Sidelying to Sit Rolling: Min guard Sidelying to sit: Min assist       General bed mobility comments: sequcencing for rolling to assist with back pain, min A to assist LLE to EOB  Transfers Overall transfer level: Needs assistance Equipment used: Rolling walker (2 wheeled) Transfers: Sit to/from Stand Sit to Stand: Min guard         General  transfer comment: educated Pt on safe hand placement with RW    Balance Overall balance assessment: Needs assistance Sitting-balance support: No upper extremity supported;Feet supported Sitting balance-Leahy Scale: Good Sitting balance - Comments: sitting EOB with no back support   Standing balance support: No upper extremity supported;During functional activity;Single extremity supported Standing balance-Leahy Scale: Fair Standing balance comment: standing at sink to complete grooming, single UE support at times                            ADL Overall ADL's : Needs assistance/impaired Eating/Feeding: Modified independent;Sitting   Grooming: Wash/dry hands;Wash/dry face;Min guard;Standing Grooming Details (indicate cue type and reason): sink level Upper Body Bathing: Set up;Sitting   Lower Body Bathing: Minimal assistance;With caregiver independent assisting;With adaptive equipment;Sitting/lateral leans Lower Body Bathing Details (indicate cue type and reason): educated Pt on long handle sponge to assist with LB bathing Upper Body Dressing : Set up;Sitting   Lower Body Dressing: Moderate assistance;With caregiver independent assisting;With adaptive equipment;Sit to/from stand Lower Body Dressing Details (indicate cue type and reason): Pt educated on AE Herbalist(grabber reacher) for LB dressing, Pt states that his girlfriend will be around 24 hours to assist with LB dressing Toilet Transfer: Min guard;Comfort height toilet;RW StatisticianToilet Transfer Details (indicate cue type and reason): standing to urinate Toileting- ArchitectClothing Manipulation and Hygiene: Supervision/safety (standing) Toileting - Clothing Manipulation Details (indicate cue type and reason): hospital gown   Tub/Shower Transfer Details (indicate cue type and reason): Pt educated on use of 3 in 1 as shower chair, and for safety emphasized that caregiver be present; handout with photos  provided to Pt Functional mobility during  ADLs: Min guard;Rolling walker;Cueing for sequencing (educated on sequence) General ADL Comments: Pt very motivated to be independent and move, Pt with chronic back pain and so back safety information applied where applicable.      Vision Vision Assessment?: No apparent visual deficits   Perception     Praxis      Pertinent Vitals/Pain Pain Assessment: 0-10 Pain Score: 10-Worst pain ever Pain Location: L hip, also back Pain Descriptors / Indicators: Tightness;Sore;Grimacing;Guarding Pain Intervention(s): Monitored during session;Ice applied;Premedicated before session     Hand Dominance Right   Extremity/Trunk Assessment Upper Extremity Assessment Upper Extremity Assessment: Overall WFL for tasks assessed   Lower Extremity Assessment Lower Extremity Assessment: LLE deficits/detail LLE Deficits / Details: post-op deficits in strength and ROM   Cervical / Trunk Assessment Cervical / Trunk Assessment: Other exceptions Cervical / Trunk Exceptions: chronic back pain   Communication Communication Communication: No difficulties   Cognition Arousal/Alertness: Awake/alert Behavior During Therapy: WFL for tasks assessed/performed Overall Cognitive Status: Within Functional Limits for tasks assessed                     General Comments       Exercises       Shoulder Instructions      Home Living Family/patient expects to be discharged to:: Private residence Living Arrangements: Spouse/significant other Available Help at Discharge: Available 24 hours/day;Friend(s) (girlfriend) Type of Home: Apartment Home Access: Level entry     Home Layout: One level     Bathroom Shower/Tub: Tub/shower unit Shower/tub characteristics: Engineer, building servicesCurtain Bathroom Toilet: Standard Bathroom Accessibility: Yes How Accessible: Accessible via walker Home Equipment: Cane - single point;Hand held shower head          Prior Functioning/Environment Level of Independence: Independent  with assistive device(s)        Comments: Pt ambulated with a SPC, independent in ADL but girlfriend doing most IADL        OT Problem List: Decreased strength;Decreased range of motion;Decreased activity tolerance;Impaired balance (sitting and/or standing);Decreased safety awareness;Decreased knowledge of use of DME or AE;Pain   OT Treatment/Interventions:      OT Goals(Current goals can be found in the care plan section) Acute Rehab OT Goals Patient Stated Goal: to get home to sleep OT Goal Formulation: With patient Time For Goal Achievement: 12/31/15 Potential to Achieve Goals: Good  OT Frequency:     Barriers to D/C:            Co-evaluation              End of Session Equipment Utilized During Treatment: Gait belt;Rolling walker Nurse Communication: Mobility status  Activity Tolerance: Patient tolerated treatment well Patient left: in bed;with call bell/phone within reach;with family/visitor present   Time: [id number] OT Time Calculation (min): 31 min Charges:  OT General Charges $OT Visit: 1 Procedure OT Evaluation $OT Eval Moderate Complexity: 1 Procedure OT Treatments $Self Care/Home Management : 8-22 mins G-Codes:    Zachary Watson 12/24/2015, 10:53 AM  Zachary Watson OTR/L [phone]

## 2015-12-24 NOTE — Progress Notes (Signed)
   Subjective:  Patient reports pain as mild.  No c/o. Denies N/V/CP/SOB/dizzyness.   Objective:   VITALS:   Vitals:   12/24/15 0146 12/24/15 0559 12/24/15 0821 12/24/15 0949  BP: (!) 90/54 (!) 85/53 (!) 91/52 (!) 92/56  Pulse: 71 71 68 69  Resp: 16 16    Temp: 98.2 F (36.8 C) 98.2 F (36.8 C)    TempSrc: Oral Oral    SpO2: 95% 93%    Weight:      Height:        NAD ABD soft Sensation intact distally Intact pulses distally Dorsiflexion/Plantar flexion intact Incision: dressing C/D/I Compartment soft   Lab Results  Component Value Date   WBC 12.0 (H) 12/24/2015   HGB 11.5 (L) 12/24/2015   HCT 32.6 (L) 12/24/2015   MCV 86.5 12/24/2015   PLT 173 12/24/2015   BMET    Component Value Date/Time   NA 136 12/24/2015 0517   K 4.1 12/24/2015 0517   CL 105 12/24/2015 0517   CO2 25 12/24/2015 0517   GLUCOSE 147 (H) 12/24/2015 0517   BUN 9 12/24/2015 0517   CREATININE 1.08 12/24/2015 0517   CALCIUM 8.1 (L) 12/24/2015 0517   GFRNONAA >60 12/24/2015 0517   GFRAA >60 12/24/2015 0517     Assessment/Plan: 1 Day Post-Op   Principal Problem:   Primary osteoarthritis of left hip  WBAT with walker DVT ppx: ASA, SCDS, TEDs Hypotn: patient baseline BP runs on the low side, I gave him bolus on 1 L NS this am for asymptomatic hypotn which is improved. hgb 11.5 PT/OT D/C home today with HHPT   Jazzmyne Rasnick, Cloyde ReamsBrian James 12/24/2015, 12:03 PM   Samson FredericBrian Aayra Hornbaker, MD Cell [phone]

## 2015-12-24 NOTE — Discharge Summary (Signed)
Physician Discharge Summary  Patient ID: Zachary Watson MRN: [id number] DOB/AGE: June 26, 1960 55 y.o.  Admit date: 12/23/2015 Discharge date: 12/24/2015  Admission Diagnoses:  Primary osteoarthritis of left hip  Discharge Diagnoses:  Principal Problem:   Primary osteoarthritis of left hip   Past Medical History:  Diagnosis Date  . Anxiety   . Arthritis   . Chronic back pain   . Depression   . GERD (gastroesophageal reflux disease)   . PTSD (post-traumatic stress disorder)     Surgeries: Procedure(s): LEFT TOTAL HIP ARTHROPLASTY ANTERIOR APPROACH on 12/23/2015   Consultants (if any):   Discharged Condition: Improved  Hospital Course: Zachary Watson is an 55 y.o. male who was admitted 12/23/2015 with a diagnosis of Primary osteoarthritis of left hip and went to the operating room on 12/23/2015 and underwent the above named procedures.    He was given perioperative antibiotics:  Anti-infectives    Start     Dose/Rate Route Frequency Ordered Stop   12/23/15 2028  ceFAZolin (ANCEF) IVPB 2g/100 mL premix     2 g 200 mL/hr over 30 Minutes Intravenous Every 6 hours 12/23/15 2028 12/23/15 2151   12/23/15 1815  ceFAZolin (ANCEF) IVPB 2g/100 mL premix  Status:  Discontinued     2 g 200 mL/hr over 30 Minutes Intravenous Every 6 hours 12/23/15 1812 12/23/15 2028   12/23/15 0940  ceFAZolin (ANCEF) IVPB 2g/100 mL premix     2 g 200 mL/hr over 30 Minutes Intravenous On call to O.R. 12/23/15 0940 12/23/15 1219    .  He was given sequential compression devices, early ambulation, and ASA for DVT prophylaxis.  He benefited maximally from the hospital stay and there were no complications.    Recent vital signs:  Vitals:   12/24/15 0949 12/24/15 1340  BP: (!) 92/56 (!) 96/57  Pulse: 69 75  Resp:    Temp:  97.8 F (36.6 C)    Recent laboratory studies:  Lab Results  Component Value Date   HGB 11.5 (L) 12/24/2015   HGB 15.6 12/12/2015   Lab Results  Component Value Date    WBC 12.0 (H) 12/24/2015   PLT 173 12/24/2015   No results found for: INR Lab Results  Component Value Date   NA 136 12/24/2015   K 4.1 12/24/2015   CL 105 12/24/2015   CO2 25 12/24/2015   BUN 9 12/24/2015   CREATININE 1.08 12/24/2015   GLUCOSE 147 (H) 12/24/2015    Discharge Medications:   Allergies as of 12/24/2015      Reactions   No Known Allergies       Medication List    STOP taking these medications   HYDROcodone-acetaminophen 5-325 MG tablet Commonly known as:  NORCO/VICODIN Replaced by:  HYDROcodone-acetaminophen 7.5-325 MG tablet     TAKE these medications   aspirin 81 MG chewable tablet Chew 1 tablet (81 mg total) by mouth 2 (two) times daily.   cetirizine 10 MG tablet Commonly known as:  ZYRTEC Take 10 mg by mouth daily.   docusate sodium 100 MG capsule Commonly known as:  COLACE Take 1 capsule (100 mg total) by mouth 2 (two) times daily.   escitalopram 20 MG tablet Commonly known as:  LEXAPRO Take 20 mg by mouth daily.   gabapentin 300 MG capsule Commonly known as:  NEURONTIN Take 300 mg by mouth 2 (two) times daily.   HYDROcodone-acetaminophen 7.5-325 MG tablet Commonly known as:  NORCO Take 1-2 tablets by mouth every 4 (four)  hours as needed (breakthrough pain). Replaces:  HYDROcodone-acetaminophen 5-325 MG tablet   methocarbamol 750 MG tablet Commonly known as:  ROBAXIN Take 750 mg by mouth 4 (four) times daily as needed for muscle spasms.   ondansetron 4 MG tablet Commonly known as:  ZOFRAN Take 1 tablet (4 mg total) by mouth every 6 (six) hours as needed for nausea.   senna 8.6 MG Tabs tablet Commonly known as:  SENOKOT Take 2 tablets (17.2 mg total) by mouth at bedtime.       Diagnostic Studies: Dg Pelvis Portable  Result Date: 12/23/2015 CLINICAL DATA:  LEFT hip replacement. EXAM: PORTABLE PELVIS 1-2 VIEWS COMPARISON:  Intraoperative radiographs performed earlier. FINDINGS: There has been LEFT total hip arthroplasty.  Satisfactory position and alignment. IMPRESSION: No adverse features. Electronically Signed   By: Elsie StainJohn T Curnes M.D.   On: 12/23/2015 16:22   Dg C-arm 61-120 Min  Result Date: 12/23/2015 CLINICAL DATA:  Anterior approach left total hip arthroplasty EXAM: DG C-ARM 61-120 MIN; OPERATIVE LEFT HIP WITH PELVIS COMPARISON:  None. FINDINGS: Fluoroscopy time 0 minutes 31 seconds. Two nondiagnostic spot fluoroscopic intraoperative radiographs of the left hip demonstrate expected postsurgical changes from left total hip arthroplasty. IMPRESSION: Intraoperative fluoroscopic guidance for left total hip arthroplasty. Electronically Signed   By: Delbert PhenixJason A Poff M.D.   On: 12/23/2015 14:18   Dg Hip Operative Unilat W Or W/o Pelvis Left  Result Date: 12/23/2015 CLINICAL DATA:  Anterior approach left total hip arthroplasty EXAM: DG C-ARM 61-120 MIN; OPERATIVE LEFT HIP WITH PELVIS COMPARISON:  None. FINDINGS: Fluoroscopy time 0 minutes 31 seconds. Two nondiagnostic spot fluoroscopic intraoperative radiographs of the left hip demonstrate expected postsurgical changes from left total hip arthroplasty. IMPRESSION: Intraoperative fluoroscopic guidance for left total hip arthroplasty. Electronically Signed   By: Delbert PhenixJason A Poff M.D.   On: 12/23/2015 14:18    Disposition: 01-Home or Self Care  Discharge Instructions    Call MD / Call 911    Complete by:  As directed    If you experience chest pain or shortness of breath, CALL 911 and be transported to the hospital emergency room.  If you develope a fever above 101 F, pus (white drainage) or increased drainage or redness at the wound, or calf pain, call your surgeon's office.   Constipation Prevention    Complete by:  As directed    Drink plenty of fluids.  Prune juice may be helpful.  You may use a stool softener, such as Colace (over the counter) 100 mg twice a day.  Use MiraLax (over the counter) for constipation as needed.   Diet - low sodium heart healthy    Complete  by:  As directed    Driving restrictions    Complete by:  As directed    No driving for 6 weeks   Increase activity slowly as tolerated    Complete by:  As directed    Lifting restrictions    Complete by:  As directed    No lifting for 6 weeks   TED hose    Complete by:  As directed    Use stockings (TED hose) for 2 weeks on both leg(s).  You may remove them at night for sleeping.      Follow-up Information    Garrus Gauthreaux, Cloyde ReamsBrian James, MD. Schedule an appointment as soon as possible for a visit in 2 week(s).   Specialty:  Orthopedic Surgery Why:  For wound re-check Contact information: [address] [place] [address]  [phone]        [facility] Follow up.   Specialty:  Home Health Services Why:  Someone from [facility] will contact you to arrange start date and time for therapy. Contact information: [address] [place] [place] [address] [phone]            Signed: Garnet KoyanagiSwinteck, Shamaria Kavan James 12/25/2015, 6:12 AM

## 2015-12-24 NOTE — Care Management Note (Signed)
Case Management Note  Patient Details  Name: Zachary Watson MRN: [id number] Date of Birth: 06/28/1960  Subjective/Objective: 55 yr old gentleman s/p left total hip arthroplasty.                 Action/Plan: Patient was preoperatively setup with [facility], no changes. RW and 3in1 have been delivered to patient's room. He will have support at discharge.   Expected Discharge Date:    12/24/15              Expected Discharge Plan:  Home w Home Health Services  In-House Referral:  NA  Discharge planning Services  CM Consult  Post Acute Care Choice:  Home Health, Durable Medical Equipment Choice offered to:  Patient  DME Arranged:  3-N-1, Walker rolling DME Agency:  [facility]  HH Arranged:  PT HH Agency[facility]  [facility]  Status of Service:  Completed, signed off  If discussed at [employer] Meetings, dates discussed:    Additional Comments:  [name], Joeli Fenner [name], RN 12/24/2015, 2:15 PM

## 2015-12-24 NOTE — Evaluation (Signed)
Physical Therapy Evaluation Patient Details Name: Zachary Watson MRN: [id number] DOB: 11/06/1960 Today's Date: 12/24/2015   History of Present Illness  Pt is a 55 y/o male presenting post-op Left direct anterior hip replacement on 12/23/15. Pt has a past medical history of Anxiety; Arthritis; Chronic back pain; Depression; and PTSD. Pt has a past surgical history that includes Wrist surgery (Right); Ankle surgery (Right); Shoulder arthroscopy (Left); Clavicle surgery (Left); Knee arthroscopy (Left).  Clinical Impression  Pt is POD #1 and is moving slowly, but well, supervision with gait into the hallway.  HEP program reviewed and pt has no STE his home, so we did not practice the steps.  He will d/c home with girlfriend's assist and HHPT services later today.   PT to follow acutely for deficits listed below.       Follow Up Recommendations Home health PT;Supervision for mobility/OOB    Equipment Recommendations  Rolling walker with 5" wheels;3in1 (PT)    Recommendations for Other Services   NA    Precautions / Restrictions Precautions Precautions: Fall Precaution Comments: direct anterior, no precautions Restrictions Weight Bearing Restrictions: Yes LLE Weight Bearing: Weight bearing as tolerated      Mobility  Bed Mobility Overal bed mobility: Needs Assistance Bed Mobility: Supine to Sit     Supine to sit: Supervision;HOB elevated     General bed mobility comments: HOB elevated and pt relying heavily on railing for leverage.  He wanted to try to do it on his own.  Verbal cues for using right leg to push/hook left leg and assist in mooving it   Transfers Overall transfer level: Needs assistance Equipment used: Rolling walker (2 wheeled) Transfers: Sit to/from Stand Sit to Stand: Supervision         General transfer comment: supervision for safety, heavy reliance on hands for support during transitions, safe technique  Ambulation/Gait Ambulation/Gait assistance:  Supervision Ambulation Distance (Feet): 100 Feet Assistive device: Rolling walker (2 wheeled) Gait Pattern/deviations: Step-to pattern;Antalgic;Trunk flexed Gait velocity: decreased Gait velocity interpretation: Below normal speed for age/gender General Gait Details: Verbal cues for upright posture, verbal cues for correct LE sequencing and safe RW use, supervision for safety.          Balance Overall balance assessment: Needs assistance Sitting-balance support: No upper extremity supported;Feet supported Sitting balance-Leahy Scale: Good     Standing balance support: Bilateral upper extremity supported;Single extremity supported Standing balance-Leahy Scale: Fair                               Pertinent Vitals/Pain Pain Assessment: 0-10 Pain Score: 8  Pain Location: L hip, also back Pain Descriptors / Indicators: Tightness;Sore;Grimacing;Guarding Pain Intervention(s): Limited activity within patient's tolerance;Monitored during session;Repositioned    Home Living Family/patient expects to be discharged to:: Private residence Living Arrangements: Spouse/significant other Available Help at Discharge: Available 24 hours/day;Friend(s) (girlfriend) Type of Home: Apartment Home Access: Level entry     Home Layout: One level Home Equipment: Cane - single point;Hand held shower head      Prior Function Level of Independence: Independent with assistive device(s)         Comments: Pt ambulated with a SPC, independent in ADL but girlfriend doing most IADL     Hand Dominance   Dominant Hand: Right    Extremity/Trunk Assessment   Upper Extremity Assessment Upper Extremity Assessment: Defer to OT evaluation    Lower Extremity Assessment Lower Extremity Assessment: LLE deficits/detail  LLE Deficits / Details: left leg with normal post op pain and weakness, ankle at least 4/5, knee 3/5, hip 3-/5    Cervical / Trunk Assessment Cervical / Trunk Assessment:  Other exceptions Cervical / Trunk Exceptions: chronic back pain  Communication   Communication: No difficulties  Cognition Arousal/Alertness: Awake/alert Behavior During Therapy: WFL for tasks assessed/performed Overall Cognitive Status: Within Functional Limits for tasks assessed                         Exercises Total Joint Exercises Ankle Circles/Pumps: AROM;Both;20 reps Quad Sets: AROM;Left;10 reps Short Arc Quad: AROM;Left;10 reps Heel Slides: AAROM;Left;10 reps Hip ABduction/ADduction: AROM;AAROM;Left;10 reps;Supine;Standing Long Arc Quad: AROM;Left;10 reps Knee Flexion: AROM;Left;10 reps;Standing Marching in Standing: AROM;Left;10 reps;Standing Standing Hip Extension: AROM;Left;10 reps;Standing   Assessment/Plan    PT Assessment Patient needs continued PT services  PT Problem List Decreased strength;Decreased range of motion;Decreased balance;Decreased activity tolerance;Decreased mobility;Decreased knowledge of use of DME;Pain          PT Treatment Interventions DME instruction;Gait training;Functional mobility training;Therapeutic activities;Therapeutic exercise;Balance training;Patient/family education;Manual techniques;Modalities    PT Goals (Current goals can be found in the Care Plan section)  Acute Rehab PT Goals Patient Stated Goal: to get home to sleep PT Goal Formulation: With patient Time For Goal Achievement: 12/31/15 Potential to Achieve Goals: Good    Frequency 7X/week           End of Session   Activity Tolerance: Patient limited by pain Patient left: in chair;with call bell/phone within reach;with family/visitor present Nurse Communication: Mobility status         Time: [id number] PT Time Calculation (min) (ACUTE ONLY): 41 min   Charges:   PT Evaluation $PT Eval Moderate Complexity: 1 Procedure PT Treatments $Gait Training: 8-22 mins $Therapeutic Exercise: 8-22 mins        [employer] B. Maleigha Colvard, PT, DPT [phone]    12/24/2015, 2:10 PM

## 2016-10-22 ENCOUNTER — Encounter (HOSPITAL_COMMUNITY): Payer: Self-pay | Admitting: *Deleted

## 2016-10-22 ENCOUNTER — Ambulatory Visit: Payer: Self-pay | Admitting: Orthopedic Surgery

## 2016-10-23 ENCOUNTER — Encounter (HOSPITAL_COMMUNITY)
Admission: RE | Disposition: A | Discharge status: Home Health | Payer: Self-pay | Source: Ambulatory Visit | Attending: Orthopedic Surgery

## 2016-10-23 ENCOUNTER — Encounter (HOSPITAL_COMMUNITY): Payer: Self-pay | Admitting: General Practice

## 2016-10-23 ENCOUNTER — Inpatient Hospital Stay (HOSPITAL_COMMUNITY): Payer: Non-veteran care | Admitting: Certified Registered Nurse Anesthetist

## 2016-10-23 ENCOUNTER — Inpatient Hospital Stay (HOSPITAL_COMMUNITY): Payer: Non-veteran care

## 2016-10-23 ENCOUNTER — Inpatient Hospital Stay (HOSPITAL_COMMUNITY)
Admission: RE | Admit: 2016-10-23 | Discharge: 2016-10-28 | DRG: 467 | Disposition: A | Discharge status: Home Health | Payer: Non-veteran care | Source: Ambulatory Visit | Attending: Orthopedic Surgery | Admitting: Orthopedic Surgery

## 2016-10-23 DIAGNOSIS — T8452XA Infection and inflammatory reaction due to internal left hip prosthesis, initial encounter: Principal | ICD-10-CM | POA: Diagnosis present

## 2016-10-23 DIAGNOSIS — D62 Acute posthemorrhagic anemia: Secondary | ICD-10-CM | POA: Diagnosis not present

## 2016-10-23 DIAGNOSIS — Z9689 Presence of other specified functional implants: Secondary | ICD-10-CM | POA: Diagnosis not present

## 2016-10-23 DIAGNOSIS — Z8711 Personal history of peptic ulcer disease: Secondary | ICD-10-CM | POA: Diagnosis not present

## 2016-10-23 DIAGNOSIS — F17210 Nicotine dependence, cigarettes, uncomplicated: Secondary | ICD-10-CM | POA: Diagnosis present

## 2016-10-23 DIAGNOSIS — Z79899 Other long term (current) drug therapy: Secondary | ICD-10-CM | POA: Diagnosis not present

## 2016-10-23 DIAGNOSIS — Y792 Prosthetic and other implants, materials and accessory orthopedic devices associated with adverse incidents: Secondary | ICD-10-CM | POA: Diagnosis not present

## 2016-10-23 DIAGNOSIS — Z09 Encounter for follow-up examination after completed treatment for conditions other than malignant neoplasm: Secondary | ICD-10-CM

## 2016-10-23 DIAGNOSIS — T8450XA Infection and inflammatory reaction due to unspecified internal joint prosthesis, initial encounter: Secondary | ICD-10-CM | POA: Diagnosis present

## 2016-10-23 DIAGNOSIS — T8452XD Infection and inflammatory reaction due to internal left hip prosthesis, subsequent encounter: Secondary | ICD-10-CM | POA: Diagnosis not present

## 2016-10-23 DIAGNOSIS — Z95828 Presence of other vascular implants and grafts: Secondary | ICD-10-CM | POA: Diagnosis not present

## 2016-10-23 DIAGNOSIS — F4310 Post-traumatic stress disorder, unspecified: Secondary | ICD-10-CM | POA: Diagnosis present

## 2016-10-23 HISTORY — DX: Migraine, unspecified, not intractable, without status migrainosus: G43.909

## 2016-10-23 HISTORY — DX: Low back pain: M54.5

## 2016-10-23 HISTORY — PX: APPLICATION OF WOUND VAC: SHX5189

## 2016-10-23 HISTORY — PX: INCISION AND DRAINAGE HIP: SHX1801

## 2016-10-23 HISTORY — DX: Low back pain, unspecified: M54.50

## 2016-10-23 HISTORY — DX: Gastric ulcer, unspecified as acute or chronic, without hemorrhage or perforation: K25.9

## 2016-10-23 HISTORY — DX: Other chronic pain: G89.29

## 2016-10-23 HISTORY — DX: Cervicalgia: M54.2

## 2016-10-23 LAB — CBC
HCT: 38.5 % — ABNORMAL LOW (ref 39.0–52.0)
HEMOGLOBIN: 13.1 g/dL (ref 13.0–17.0)
MCH: 28.9 pg (ref 26.0–34.0)
MCHC: 34 g/dL (ref 30.0–36.0)
MCV: 85 fL (ref 78.0–100.0)
PLATELETS: 198 10*3/uL (ref 150–400)
RBC: 4.53 MIL/uL (ref 4.22–5.81)
RDW: 15.2 % (ref 11.5–15.5)
WBC: 7.5 10*3/uL (ref 4.0–10.5)

## 2016-10-23 LAB — BASIC METABOLIC PANEL
Anion gap: 7 (ref 5–15)
BUN: 7 mg/dL (ref 6–20)
CO2: 23 mmol/L (ref 22–32)
CREATININE: 0.79 mg/dL (ref 0.61–1.24)
Calcium: 8.7 mg/dL — ABNORMAL LOW (ref 8.9–10.3)
Chloride: 107 mmol/L (ref 101–111)
Glucose, Bld: 105 mg/dL — ABNORMAL HIGH (ref 65–99)
Potassium: 4 mmol/L (ref 3.5–5.1)
SODIUM: 137 mmol/L (ref 135–145)

## 2016-10-23 SURGERY — IRRIGATION AND DEBRIDEMENT HIP WITH POLY EXCHANGE
Anesthesia: General | Site: Hip | Laterality: Left

## 2016-10-23 SURGERY — IRRIGATION AND DEBRIDEMENT HIP WITH POLY EXCHANGE
Anesthesia: General | Laterality: Left

## 2016-10-23 MED ORDER — METHOCARBAMOL 1000 MG/10ML IJ SOLN
500.0000 mg | Freq: Four times a day (QID) | INTRAVENOUS | Status: DC | PRN
  Filled 2016-10-23: qty 5

## 2016-10-23 MED ORDER — HYDROCODONE-ACETAMINOPHEN 10-325 MG PO TABS
2.0000 | ORAL_TABLET | ORAL | Status: DC | PRN
  Administered 2016-10-23 – 2016-10-28 (×11): 2 via ORAL
  Filled 2016-10-23 (×11): qty 2

## 2016-10-23 MED ORDER — ACETAMINOPHEN 325 MG PO TABS
650.0000 mg | ORAL_TABLET | ORAL | Status: DC | PRN
  Administered 2016-10-24: 650 mg via ORAL
  Filled 2016-10-23: qty 2

## 2016-10-23 MED ORDER — DOCUSATE SODIUM 100 MG PO CAPS
100.0000 mg | ORAL_CAPSULE | Freq: Two times a day (BID) | ORAL | Status: DC
  Administered 2016-10-23 – 2016-10-27 (×9): 100 mg via ORAL
  Filled 2016-10-23 (×10): qty 1

## 2016-10-23 MED ORDER — FENTANYL CITRATE (PF) 250 MCG/5ML IJ SOLN
INTRAMUSCULAR | Status: AC
  Filled 2016-10-23: qty 5

## 2016-10-23 MED ORDER — PROPOFOL 10 MG/ML IV BOLUS
INTRAVENOUS | Status: AC
  Filled 2016-10-23: qty 20

## 2016-10-23 MED ORDER — SUGAMMADEX SODIUM 200 MG/2ML IV SOLN
INTRAVENOUS | Status: DC | PRN
  Administered 2016-10-23: 167.8 mg via INTRAVENOUS

## 2016-10-23 MED ORDER — METOCLOPRAMIDE HCL 5 MG PO TABS: 5.0000 mg | ORAL_TABLET | Freq: Three times a day (TID) | ORAL | Status: DC | PRN

## 2016-10-23 MED ORDER — DEXTROSE 5 % IV SOLN
3.0000 g | INTRAVENOUS | Status: AC
  Administered 2016-10-23: 3 g via INTRAVENOUS
  Filled 2016-10-23 (×2): qty 3000

## 2016-10-23 MED ORDER — VANCOMYCIN HCL IN DEXTROSE 1-5 GM/200ML-% IV SOLN
INTRAVENOUS | Status: AC
  Filled 2016-10-23: qty 200

## 2016-10-23 MED ORDER — METOCLOPRAMIDE HCL 5 MG/ML IJ SOLN: 5.0000 mg | Freq: Three times a day (TID) | INTRAMUSCULAR | Status: DC | PRN

## 2016-10-23 MED ORDER — ENSURE ENLIVE PO LIQD
237.0000 mL | Freq: Two times a day (BID) | ORAL | Status: DC
  Administered 2016-10-23 – 2016-10-27 (×8): 237 mL via ORAL

## 2016-10-23 MED ORDER — PROPOFOL 10 MG/ML IV BOLUS
INTRAVENOUS | Status: DC | PRN
  Administered 2016-10-23: 120 mg via INTRAVENOUS

## 2016-10-23 MED ORDER — ACETAMINOPHEN 650 MG RE SUPP: 650.0000 mg | RECTAL | Status: DC | PRN

## 2016-10-23 MED ORDER — 0.9 % SODIUM CHLORIDE (POUR BTL) OPTIME
TOPICAL | Status: DC | PRN
  Administered 2016-10-23: 1000 mL

## 2016-10-23 MED ORDER — ESCITALOPRAM OXALATE 10 MG PO TABS
20.0000 mg | ORAL_TABLET | Freq: Every day | ORAL | Status: DC
  Administered 2016-10-23 – 2016-10-27 (×5): 20 mg via ORAL
  Filled 2016-10-23 (×5): qty 2

## 2016-10-23 MED ORDER — GABAPENTIN 300 MG PO CAPS
300.0000 mg | ORAL_CAPSULE | Freq: Two times a day (BID) | ORAL | Status: DC
  Administered 2016-10-23 – 2016-10-27 (×10): 300 mg via ORAL
  Filled 2016-10-23 (×10): qty 1

## 2016-10-23 MED ORDER — KETOROLAC TROMETHAMINE 15 MG/ML IJ SOLN
INTRAMUSCULAR | Status: AC
  Filled 2016-10-23: qty 1

## 2016-10-23 MED ORDER — HYDROCODONE-ACETAMINOPHEN 10-325 MG PO TABS
1.0000 | ORAL_TABLET | ORAL | Status: DC | PRN
  Administered 2016-10-25 (×2): 1 via ORAL
  Filled 2016-10-23 (×3): qty 1

## 2016-10-23 MED ORDER — ONDANSETRON HCL 4 MG/2ML IJ SOLN: 4.0000 mg | Freq: Four times a day (QID) | INTRAMUSCULAR | Status: DC | PRN

## 2016-10-23 MED ORDER — MIDAZOLAM HCL 5 MG/5ML IJ SOLN
INTRAMUSCULAR | Status: DC | PRN
  Administered 2016-10-23: 2 mg via INTRAVENOUS

## 2016-10-23 MED ORDER — FENTANYL CITRATE (PF) 100 MCG/2ML IJ SOLN
INTRAMUSCULAR | Status: DC | PRN
  Administered 2016-10-23: 25 ug via INTRAVENOUS
  Administered 2016-10-23 (×2): 50 ug via INTRAVENOUS
  Administered 2016-10-23: 25 ug via INTRAVENOUS
  Administered 2016-10-23: 50 ug via INTRAVENOUS
  Administered 2016-10-23: 25 ug via INTRAVENOUS
  Administered 2016-10-23: 50 ug via INTRAVENOUS
  Administered 2016-10-23: 25 ug via INTRAVENOUS
  Administered 2016-10-23: 50 ug via INTRAVENOUS
  Administered 2016-10-23 (×4): 25 ug via INTRAVENOUS
  Administered 2016-10-23: 50 ug via INTRAVENOUS

## 2016-10-23 MED ORDER — HYDROMORPHONE HCL 1 MG/ML IJ SOLN
0.5000 mg | INTRAMUSCULAR | Status: DC | PRN
  Administered 2016-10-24: 2 mg via INTRAVENOUS
  Administered 2016-10-24: 1 mg via INTRAVENOUS
  Filled 2016-10-23: qty 2
  Filled 2016-10-23: qty 1

## 2016-10-23 MED ORDER — VANCOMYCIN HCL IN DEXTROSE 1-5 GM/200ML-% IV SOLN
1000.0000 mg | INTRAVENOUS | Status: AC
  Administered 2016-10-23: 1000 mg via INTRAVENOUS

## 2016-10-23 MED ORDER — HYDROMORPHONE HCL 1 MG/ML IJ SOLN
INTRAMUSCULAR | Status: AC
  Administered 2016-10-23: 0.5 mg via INTRAVENOUS
  Filled 2016-10-23: qty 1

## 2016-10-23 MED ORDER — VANCOMYCIN HCL IN DEXTROSE 1-5 GM/200ML-% IV SOLN
1000.0000 mg | Freq: Three times a day (TID) | INTRAVENOUS | Status: DC
  Administered 2016-10-23 – 2016-10-25 (×6): 1000 mg via INTRAVENOUS
  Filled 2016-10-23 (×7): qty 200

## 2016-10-23 MED ORDER — ONDANSETRON HCL 4 MG PO TABS
4.0000 mg | ORAL_TABLET | Freq: Four times a day (QID) | ORAL | Status: DC | PRN
  Administered 2016-10-24: 4 mg via ORAL
  Filled 2016-10-23: qty 1

## 2016-10-23 MED ORDER — OXYCODONE HCL 5 MG/5ML PO SOLN: 5.0000 mg | Freq: Once | ORAL | Status: DC | PRN

## 2016-10-23 MED ORDER — SODIUM CHLORIDE 0.9 % IV SOLN
INTRAVENOUS | Status: DC
  Administered 2016-10-23 (×2): via INTRAVENOUS

## 2016-10-23 MED ORDER — OXYCODONE HCL 5 MG PO TABS: 5.0000 mg | ORAL_TABLET | Freq: Once | ORAL | Status: DC | PRN

## 2016-10-23 MED ORDER — CHLORHEXIDINE GLUCONATE 4 % EX LIQD: 60.0000 mL | Freq: Once | CUTANEOUS | Status: DC

## 2016-10-23 MED ORDER — LACTATED RINGERS IV SOLN
INTRAVENOUS | Status: DC | PRN
  Administered 2016-10-23 (×2): via INTRAVENOUS

## 2016-10-23 MED ORDER — DIPHENHYDRAMINE HCL 12.5 MG/5ML PO ELIX: 12.5000 mg | ORAL_SOLUTION | ORAL | Status: DC | PRN

## 2016-10-23 MED ORDER — CEFAZOLIN SODIUM-DEXTROSE 1-4 GM/50ML-% IV SOLN
1.0000 g | Freq: Three times a day (TID) | INTRAVENOUS | Status: DC
  Administered 2016-10-23 – 2016-10-26 (×9): 1 g via INTRAVENOUS
  Filled 2016-10-23 (×10): qty 50

## 2016-10-23 MED ORDER — MIDAZOLAM HCL 2 MG/2ML IJ SOLN
INTRAMUSCULAR | Status: AC
  Filled 2016-10-23: qty 2

## 2016-10-23 MED ORDER — ONDANSETRON HCL 4 MG/2ML IJ SOLN
INTRAMUSCULAR | Status: DC | PRN
  Administered 2016-10-23: 4 mg via INTRAVENOUS

## 2016-10-23 MED ORDER — ROCURONIUM BROMIDE 100 MG/10ML IV SOLN
INTRAVENOUS | Status: DC | PRN
  Administered 2016-10-23: 50 mg via INTRAVENOUS

## 2016-10-23 MED ORDER — SENNA 8.6 MG PO TABS
1.0000 | ORAL_TABLET | Freq: Two times a day (BID) | ORAL | Status: DC
  Administered 2016-10-23 – 2016-10-27 (×9): 8.6 mg via ORAL
  Filled 2016-10-23 (×11): qty 1

## 2016-10-23 MED ORDER — LIDOCAINE HCL (CARDIAC) 20 MG/ML IV SOLN
INTRAVENOUS | Status: DC | PRN
  Administered 2016-10-23: 60 mg via INTRAVENOUS

## 2016-10-23 MED ORDER — METHOCARBAMOL 500 MG PO TABS
500.0000 mg | ORAL_TABLET | Freq: Four times a day (QID) | ORAL | Status: DC | PRN
  Administered 2016-10-23 – 2016-10-28 (×7): 500 mg via ORAL
  Filled 2016-10-23 (×7): qty 1

## 2016-10-23 MED ORDER — POLYETHYLENE GLYCOL 3350 17 G PO PACK: 17.0000 g | PACK | Freq: Every day | ORAL | Status: DC | PRN

## 2016-10-23 MED ORDER — HYDROMORPHONE HCL 1 MG/ML IJ SOLN
0.2500 mg | INTRAMUSCULAR | Status: DC | PRN
  Administered 2016-10-23 (×4): 0.5 mg via INTRAVENOUS

## 2016-10-23 MED ORDER — KETOROLAC TROMETHAMINE 15 MG/ML IJ SOLN
7.5000 mg | Freq: Four times a day (QID) | INTRAMUSCULAR | Status: AC
  Administered 2016-10-23 – 2016-10-24 (×4): 7.5 mg via INTRAVENOUS
  Filled 2016-10-23 (×3): qty 1

## 2016-10-23 MED ORDER — ENOXAPARIN SODIUM 40 MG/0.4ML ~~LOC~~ SOLN
40.0000 mg | SUBCUTANEOUS | Status: DC
  Administered 2016-10-24 – 2016-10-27 (×4): 40 mg via SUBCUTANEOUS
  Filled 2016-10-23 (×4): qty 0.4

## 2016-10-23 MED ORDER — SODIUM CHLORIDE 0.9 % IV SOLN
INTRAVENOUS | Status: DC | PRN
  Administered 2016-10-23: 500 mL

## 2016-10-23 MED ORDER — BOOST / RESOURCE BREEZE PO LIQD
1.0000 | Freq: Three times a day (TID) | ORAL | Status: DC
Start: 2016-10-23 — End: 2016-10-23
  Administered 2016-10-23: 1 via ORAL

## 2016-10-23 SURGICAL SUPPLY — 49 items
CANISTER WOUND CARE 500ML ATS (WOUND CARE) ×2 IMPLANT
CONT SPEC 4OZ CLIKSEAL STRL BL (MISCELLANEOUS) ×2 IMPLANT
COVER SURGICAL LIGHT HANDLE (MISCELLANEOUS) ×3 IMPLANT
DRAPE ORTHO SPLIT 77X108 STRL (DRAPES) ×6
DRAPE STERI IOBAN 125X83 (DRAPES) ×2 IMPLANT
DRAPE SURG ORHT 6 SPLT 77X108 (DRAPES) ×2 IMPLANT
DRAPE U-SHAPE 47X51 STRL (DRAPES) ×3 IMPLANT
DRESSING PREVENA PLUS CUSTOM (GAUZE/BANDAGES/DRESSINGS) IMPLANT
DRSG AQUACEL AG ADV 3.5X10 (GAUZE/BANDAGES/DRESSINGS) ×2 IMPLANT
DRSG PREVENA PLUS CUSTOM (GAUZE/BANDAGES/DRESSINGS) ×3
DURAPREP 26ML APPLICATOR (WOUND CARE) ×1 IMPLANT
ELECT BLADE 4.0 EZ CLEAN MEGAD (MISCELLANEOUS) ×3
ELECTRODE BLDE 4.0 EZ CLN MEGD (MISCELLANEOUS) IMPLANT
EVACUATOR 1/8 PVC DRAIN (DRAIN) ×3 IMPLANT
GLOVE BIO SURGEON STRL SZ8.5 (GLOVE) ×6 IMPLANT
GLOVE BIOGEL PI IND STRL 7.5 (GLOVE) IMPLANT
GLOVE BIOGEL PI IND STRL 8.5 (GLOVE) ×1 IMPLANT
GLOVE BIOGEL PI INDICATOR 7.5 (GLOVE) ×2
GLOVE BIOGEL PI INDICATOR 8.5 (GLOVE) ×2
GLOVE ECLIPSE 7.0 STRL STRAW (GLOVE) ×2 IMPLANT
GOWN SPEC L3 XXLG W/TWL (GOWN DISPOSABLE) ×6 IMPLANT
GOWN STRL REUS W/ TWL LRG LVL3 (GOWN DISPOSABLE) ×1 IMPLANT
GOWN STRL REUS W/TWL LRG LVL3 (GOWN DISPOSABLE) ×3
HANDPIECE INTERPULSE COAX TIP (DISPOSABLE) ×3
HEAD CER BIO DELTA 36 PLUS 1.5 (Hips) ×2 IMPLANT
HOOD PEEL AWAY FLYTE STAYCOOL (MISCELLANEOUS) ×4 IMPLANT
IV NS IRRIG 3000ML ARTHROMATIC (IV SOLUTION) ×3 IMPLANT
KIT BASIN OR (CUSTOM PROCEDURE TRAY) ×3 IMPLANT
KIT DRSG PREVENA PLUS 7DAY 125 (MISCELLANEOUS) ×2 IMPLANT
LINER NEUTRAL 36X58 PLUS4 ×2 IMPLANT
MANIFOLD NEPTUNE II (INSTRUMENTS) ×6 IMPLANT
PACK TOTAL JOINT (CUSTOM PROCEDURE TRAY) ×3 IMPLANT
SEALER BIPOLAR AQUA 6.0 (INSTRUMENTS) ×2 IMPLANT
SET HNDPC FAN SPRY TIP SCT (DISPOSABLE) ×1 IMPLANT
STAPLER VISISTAT 35W (STAPLE) IMPLANT
SUT ETHIBOND NAB CT1 #1 30IN (SUTURE) ×4 IMPLANT
SUT ETHILON 2 0 FS 18 (SUTURE) ×2 IMPLANT
SUT ETHILON 2 0 PSLX (SUTURE) ×4 IMPLANT
SUT MNCRL AB 4-0 PS2 18 (SUTURE) IMPLANT
SUT MON AB 2-0 CT1 36 (SUTURE) ×6 IMPLANT
SUT PDS AB 1 CTX 36 (SUTURE) ×4 IMPLANT
SUT VIC AB 0 CT1 27 (SUTURE) ×6
SUT VIC AB 0 CT1 27XBRD ANBCTR (SUTURE) ×2 IMPLANT
SUT VIC AB 2-0 CT1 27 (SUTURE) ×6
SUT VIC AB 2-0 CT1 TAPERPNT 27 (SUTURE) ×2 IMPLANT
SUT VLOC 180 0 24IN GS25 (SUTURE) ×2 IMPLANT
SWAB COLLECTION DEVICE MRSA (MISCELLANEOUS) ×3 IMPLANT
SWAB CULTURE ESWAB REG 1ML (MISCELLANEOUS) ×3 IMPLANT
TOWEL OR 17X26 4PK STRL BLUE (TOWEL DISPOSABLE) ×6 IMPLANT

## 2016-10-23 NOTE — Anesthesia Preprocedure Evaluation (Signed)
Anesthesia Evaluation  Patient identified by MRN, date of birth, ID band Patient awake    Reviewed: Allergy & Precautions, H&P , NPO status , Patient's Chart, lab work & pertinent test results  Airway Mallampati: II   Neck ROM: full    Dental   Pulmonary Current Smoker,    breath sounds clear to auscultation       Cardiovascular negative cardio ROS   Rhythm:regular Rate:Normal     Neuro/Psych  Headaches, PSYCHIATRIC DISORDERS Anxiety Depression    GI/Hepatic PUD, GERD  ,  Endo/Other    Renal/GU      Musculoskeletal  (+) Arthritis ,   Abdominal   Peds  Hematology   Anesthesia Other Findings   Reproductive/Obstetrics                             Anesthesia Physical Anesthesia Plan  ASA: II  Anesthesia Plan: General   Post-op Pain Management:    Induction: Intravenous  PONV Risk Score and Plan: 1 and Ondansetron, Dexamethasone, Midazolam and Treatment may vary due to age or medical condition  Airway Management Planned: Oral ETT  Additional Equipment:   Intra-op Plan:   Post-operative Plan: Extubation in OR  Informed Consent: I have reviewed the patients History and Physical, chart, labs and discussed the procedure including the risks, benefits and alternatives for the proposed anesthesia with the patient or authorized representative who has indicated his/her understanding and acceptance.     Plan Discussed with: CRNA, Anesthesiologist and Surgeon  Anesthesia Plan Comments:         Anesthesia Quick Evaluation

## 2016-10-23 NOTE — Discharge Instructions (Signed)
Keep dressing clean and dry. Do not remove dressing. Charge VAC unit nightly.

## 2016-10-23 NOTE — Progress Notes (Signed)
Pharmacy Antibiotic Note  Zachary Watson is a [age] y.o. male admitted on 10/23/2016 with prosthetic joint infection of left hip s/p I&D today.  He had left THA 12/23/15. Pharmacy has been consulted for vancomycin and cefazolin dosing. Renal function is normal.   Pre-op cefazolin 3 g IV given at 07:44, vancomycin 1000 mg IV given at 08:12  Plan: Vancomycin 1000 mg IV q8h Cefazolin 1 g IV q8h Monitor renal function, clinical progress, and culture data Vancomycin trough at Css  Height: 5\' 11"  (180.3 cm) Weight: 185 lb (83.9 kg) IBW/kg (Calculated) : 75.3  Temp (24hrs), Avg:97.9 F (36.6 C), Min:97.8 F (36.6 C), Max:97.9 F (36.6 C)   Recent Labs Lab 10/23/16 0644  WBC 7.5  CREATININE 0.79    Estimated Creatinine Clearance: 109.8 mL/min (by C-G formula based on SCr of 0.79 mg/dL).    No Known Allergies  Antimicrobials this admission: Vancomycin 10/19 >>  Cefazolin 10/19 >>   Dose adjustments this admission:   Microbiology results: 10/19 left hip tissue:   Thank you for allowing pharmacy to be a part of this patient's care.  Loura BackJennifer Heartwell, PharmD, BCPS Clinical Pharmacist Phone for today [phone]- [phone] Main pharmacy - [phone] 10/23/2016 12:51 PM

## 2016-10-23 NOTE — Evaluation (Signed)
Physical Therapy Evaluation Patient Details Name: Zachary Watson MRN: [id number] DOB: [birth year] Today's Date: 10/23/2016   History of Present Illness  Pt is a 56 y.o. male admitted on 10/23/16 with postoperative hip infection (from [facility]A in 12/2015), now s/p I&D with placement of wound vac and suction drain. Pertinent PMH includes PTSD, GERD, depression, anxiety, arthritis, chronic LBP.     Clinical Impression  Pt presents with pain and an overall decrease in functional mobility secondary to above. PTA, pt mod indep with SPC; lives with girlfriend who will be able to provide 24/7 assist if needed upon initial d/c. Educ on precautions, positioning,  and importance of mobility. Today, pt transferred and amb with RW and supervision for safety; assist for lines/wound vac management. Pt would benefit from continued acute PT services to maximize functional mobility and independence prior to d/c home.     Follow Up Recommendations No PT follow up;Supervision - Intermittent    Equipment Recommendations  None recommended by PT    Recommendations for Other Services       Precautions / Restrictions Precautions Precautions: None Restrictions Weight Bearing Restrictions: Yes LLE Weight Bearing: Weight bearing as tolerated      Mobility  Bed Mobility Overal bed mobility: Independent                Transfers Overall transfer level: Modified independent Equipment used: Rolling walker (2 wheeled)                Ambulation/Gait Ambulation/Gait assistance: Supervision Ambulation Distance (Feet): 100 Feet Assistive device: Rolling walker (2 wheeled) Gait Pattern/deviations: Step-through pattern;Decreased stride length;Decreased weight shift to left;Antalgic Gait velocity: Decreased Gait velocity interpretation: <1.8 ft/sec, indicative of risk for recurrent falls General Gait Details: Slow, controlled gait with RW and supervision for safety; cues for technique  Stairs            Wheelchair Mobility    Modified Rankin (Stroke Patients Only)       Balance Overall balance assessment: Needs assistance Sitting-balance support: No upper extremity supported;Feet supported;Feet unsupported Sitting balance-Leahy Scale: Good     Standing balance support: No upper extremity supported;During functional activity;Bilateral upper extremity supported Standing balance-Leahy Scale: Fair Standing balance comment: Able to static stand with no UE support and supervision                              Pertinent Vitals/Pain Pain Assessment: Faces Faces Pain Scale: Hurts little more Pain Location: L hip Pain Descriptors / Indicators: Discomfort;Sore Pain Intervention(s): Monitored during session;Repositioned    Home Living Family/patient expects to be discharged to:: Private residence Living Arrangements: Spouse/significant other (Girlfriend) Available Help at Discharge: Family;Friend(s);Available 24 hours/day Type of Home: Apartment Home Access: Level entry     Home Layout: One level Home Equipment: Cane - single point;Hand held Careers information officer - 2 wheels      Prior Function Level of Independence: Independent with assistive device(s)         Comments: Pt ambulated with a SPC, independent in ADL but girlfriend doing most IADL     Hand Dominance   Dominant Hand: Right    Extremity/Trunk Assessment   Upper Extremity Assessment Upper Extremity Assessment: Overall WFL for tasks assessed    Lower Extremity Assessment Lower Extremity Assessment: LLE deficits/detail LLE Deficits / Details: s/p L hip I&D; grossly 4/5 throughout       Communication   Communication: No difficulties  Cognition Arousal/Alertness: Awake/alert  Behavior During Therapy: WFL for tasks assessed/performed Overall Cognitive Status: Within Functional Limits for tasks assessed                                        General Comments General  comments (skin integrity, edema, etc.): Girlfriend present during session    Exercises     Assessment/Plan    PT Assessment Patient needs continued PT services  PT Problem List Decreased strength;Decreased activity tolerance;Decreased balance;Decreased mobility;Decreased knowledge of use of DME;Pain       PT Treatment Interventions DME instruction;Gait training;Stair training;Functional mobility training;Therapeutic exercise;Therapeutic activities;Balance training;Patient/family education    PT Goals (Current goals can be found in the Care Plan section)  Acute Rehab PT Goals Patient Stated Goal: Return home PT Goal Formulation: With patient Time For Goal Achievement: 11/06/16 Potential to Achieve Goals: Good    Frequency Min 5X/week   Barriers to discharge        Co-evaluation               AM-PAC PT "6 Clicks" Daily Activity  Outcome Measure Difficulty turning over in bed (including adjusting bedclothes, sheets and blankets)?: None Difficulty moving from lying on back to sitting on the side of the bed? : None Difficulty sitting down on and standing up from a chair with arms (e.g., wheelchair, bedside commode, etc,.)?: None Help needed moving to and from a bed to chair (including a wheelchair)?: None Help needed walking in hospital room?: A Little Help needed climbing 3-5 steps with a railing? : A Little 6 Click Score: 22    End of Session Equipment Utilized During Treatment: Gait belt Activity Tolerance: Patient tolerated treatment well Patient left: in chair;with call bell/phone within reach;with family/visitor present Nurse Communication: Mobility status PT Visit Diagnosis: Other abnormalities of gait and mobility (R26.89);Pain Pain - Right/Left: Left Pain - part of body: Hip    Time: 1430-1449 PT Time Calculation (min) (ACUTE ONLY): 19 min   Charges:   PT Evaluation $PT Eval Low Complexity: 1 Low     PT G Codes:       Ina HomesJaclyn Thersea [place]fred[place], PT, DPT Acute  Rehab Services  Pager: [phone]  Malachy ChamberJaclyn L Honestee Revard 10/23/2016, 3:04 PM

## 2016-10-23 NOTE — Consult Note (Signed)
[facility]    Date of Admission:  10/23/2016          Reason for Consult: Prosthetic Joint Infection    Referring Provider: Veda CanningSwintek Primary Care Provider: Elissa HeftyBachmann, Kurt, MD   Assessment/Plan: 56 year old male with postoperative hip infection s/p irrigation and debridement with placement of wound vac and suction drain. Cultures obtained with no current organisms noted presently.   -  Agree with broad spectrum Ancef and vancomycin. Narrow pending cultures.    Principal Problem:   Infection of total joint prosthesis (HCC)   Scheduled Meds: . docusate sodium  100 mg Oral BID  . [START ON 10/24/2016] enoxaparin (LOVENOX) injection  40 mg Subcutaneous Q24H  . escitalopram  20 mg Oral Daily  . gabapentin  300 mg Oral BID  . ketorolac  7.5 mg Intravenous Q6H  . ketorolac      . senna  1 tablet Oral BID   Continuous Infusions: . sodium chloride 125 mL/hr at 10/23/16 1246  . methocarbamol (ROBAXIN)  IV     PRN Meds:.acetaminophen **OR** acetaminophen, diphenhydrAMINE, HYDROcodone-acetaminophen, HYDROcodone-acetaminophen, HYDROmorphone (DILAUDID) injection, methocarbamol **OR** methocarbamol (ROBAXIN)  IV, metoCLOPramide **OR** metoCLOPramide (REGLAN) injection, ondansetron **OR** ondansetron (ZOFRAN) IV, polyethylene glycol  HPI: Zachary Watson is a 56 y.o. male with PMH of PTSD, chronic back pain and arthritis who was admitted on 10/23/16 for irrigation and debridement of the left hip and application of a wound vac. Underwent left total hip arthoplasty on 12/23/15 and was in his usual state of health until about 2-3 weeks ago when he noted a developing pustule about the size of a dime that ruptured with purulent drainage. [facility] doxycycline which did help a little. Denies any fevers at the time. He was referred to Dr. Jovita GammaSwintek's office for evaluation and surgical intervention. Had a 6 mm wound at the inferior aspect of  his incision with seropurulent drainage. During surgery anaerobic and aerobic cultures were obtained during the procedure and currently pending. Placed on Ancef and vancomycin pending cultures. Post-procedure imaging showed recent evidence of debridement with subcutanous emphysema and surgical drain placement.    Review of Systems: Review of Systems  Constitutional: Negative for chills and fever.  Cardiovascular: Negative for chest pain and palpitations.  Musculoskeletal: Negative for back pain.  Skin: Negative for rash.  Neurological: Negative for weakness.    Past Medical History:  Diagnosis Date  . Anxiety   . Arthritis   . Chronic back pain   . Depression   . Gastric ulcer   . GERD (gastroesophageal reflux disease)   . Headache   . PTSD (post-traumatic stress disorder)   . PTSD (post-traumatic stress disorder)     Social History  Substance Use Topics  . Smoking status: Current Every Day Smoker    Packs/day: 1.50    Years: 40.00  . Smokeless tobacco: Former NeurosurgeonUser    Types: Chew  . Alcohol use Yes     Comment: occasionally    Family History  Problem Relation Age of Onset  . Heart attack Mother    No Known Allergies  OBJECTIVE: Blood pressure (!) 89/76, pulse 86, temperature 97.8 F (36.6 C), resp. rate 17, height 5\' 11"  (1.803 m), weight 185 lb (83.9 kg), SpO2 98 %.  Physical Exam  Constitutional: He is well-developed, well-nourished, and in no distress. No distress.  Cardiovascular: Normal rate, regular rhythm, normal heart sounds and  intact distal pulses.  Exam reveals no gallop and no friction rub.   No murmur heard. Pulmonary/Chest: Effort normal and breath sounds normal. No respiratory distress. He has no wheezes. He has no rales. He exhibits no tenderness.  Musculoskeletal:  Surgical wound of left thigh with wound vac and suction drain appears clean, dry and intact with sanguinous drainage. Distal pulses and sensation are intact and appropriate.   Skin:  Skin is warm and dry. No rash noted.    Lab Results Lab Results  Component Value Date   WBC 7.5 10/23/2016   HGB 13.1 10/23/2016   HCT 38.5 (L) 10/23/2016   MCV 85.0 10/23/2016   PLT 198 10/23/2016    Lab Results  Component Value Date   CREATININE 0.79 10/23/2016   BUN 7 10/23/2016   NA 137 10/23/2016   K 4.0 10/23/2016   CL 107 10/23/2016   CO2 23 10/23/2016   No results found for: ALT, AST, GGT, ALKPHOS, BILITOT   Microbiology: Recent Results (from the past 240 hour(s))  Aerobic/Anaerobic Culture (surgical/deep wound)     Status: None (Preliminary result)   Collection Time: 10/23/16  8:13 AM  Result Value Ref Range Status   Specimen Description WOUND LEFT HIP  Final   Special Requests SWAB SPEC A  Final   Gram Stain   Final    MODERATE WBC PRESENT, PREDOMINANTLY PMN NO ORGANISMS SEEN    Culture PENDING  Incomplete   Report Status PENDING  Incomplete  Aerobic Culture (superficial specimen)     Status: None (Preliminary result)   Collection Time: 10/23/16  8:17 AM  Result Value Ref Range Status   Specimen Description TISSUE LEFT HIP PSEUDO CAPSULE  Final   Special Requests SPEC B  Final   Gram Stain   Final    MODERATE WBC PRESENT,BOTH PMN AND MONONUCLEAR NO ORGANISMS SEEN    Culture PENDING  Incomplete   Report Status PENDING  Incomplete   Marcos Eke, NP [facility] [phone] Pager [phone] Cell  10/23/2016, 12:55 PM

## 2016-10-23 NOTE — Op Note (Signed)
OPERATIVE REPORT   10/23/2016  10:18 AM  PATIENT:  Zachary Watson   SURGEON:  Danne Scardina, Cloyde ReamsBrian James, MD  ASSISTANT:  April Green, RNFA.   PREOPERATIVE DIAGNOSIS:  Periprosthetic joint infection, left total hip arthroplasty.  POSTOPERATIVE DIAGNOSIS:  Same.  PROCEDURE:  1. IRRIGATION AND DEBRIDEMENT LEFT HIP WITH HEAD BALL AND POLY LINER EXCHANGE 2. APPLICATION OF INCISIONAL WOUND VAC.  ANESTHESIA:   GETA.  ANTIBIOTICS:  2 g Ancef. 1 g vancomycin.  ESTIMATED BLOOD LOSS: 500 mL.  EXPLANTS: DePuy Biolox ceramic head ball 36+1.5 mm. 36+5 Altrx polyliner.  IMPLANTS:  Biolox ceramic head ball with revision sleeve 36+1.5 mm. 36 x 58 mm +4 neutral liner.  SPECIMENS:  Left hip synovial fluid swab for culture. Left hip pseudocapsule for tissue culture.  TUBES AND DRAINS: Medium Hemovac 1. Prevena incisional wound VAC.  COMPLICATIONS:  None.  DISPOSITION:  Stable to PACU.  SURGICAL INDICATIONS:  Zachary NeedleLawrence C Watson is a 56 y.o. male who underwent primary left total hip in December 2017. Patient did well until 2 weeks ago and warmth over the left hip incision. He then developed a pustule, and [facility], and he was started on oral antibiotics.  He was referred for orthopedic management.  We had a lengthy discussion regarding debridement with head ball and liner exchange versus a two-stage procedure, and the patient wanted to attempt to salvage his implants.  The risks, benefits, and alternatives were discussed with the patient preoperatively including but not limited to the risks of infection, bleeding, nerve / blood vessel injury, malunion, nonunion, cardiopulmonary complications, the need for repeat surgery, among others, and the patient was willing to proceed.  PROCEDURE IN DETAIL: I identified the patient in the preop holding area. The surgical site was marked by myself. He was taken to the operating room, and general endotracheal anesthesia was induced.   [facility] The left hip was prepped and draped in the normal sterile surgical fashion. Timeout was called verifying side and site of surgery.  I began by examining the left hip. He had a draining sinus tract at the inferior aspect of the incision. I used a #10 blade to excisionally debride the sinus tract and his previous scar. He did have a small defect in the fascia proceeding deep into the joint. I utilized the previous fascial incision. It was difficult to find the interval between the TFL and the rectus, however this was accomplished. Retractors were placed around the neck. I made a capsulotomy. He had a small amount of purulent joint fluid within the joint. Aerobic and anaerobic culture swabs were obtained. I excisionally debrided some pseudocapsule to send for a tissue culture. I applied traction to the joint and I used a bone tamp to remove the head from the trunnion. The head was passed off the table. External rotation was applied. I performed a radical synovectomy of the hip joint. A capsulectomy was performed. There was no significant necrotic tissue. There was no necrotic muscle. Other than the small amount of purulent joint fluid, there were no overt signs of infection to include chronic synovitis. This appeared to be an acute infection. I debrided the interface around the acetabular component. I used an osteotome to remove the polyethylene liner. I used a curet to debride the screw holes of the cup. The cup was stable. I then turned my attention to the proximal femur. I cleaned the interface around the implant and bone. There  was no destruction of the bone. There was no purulent material emanating from around the implants. The hip was copiously irrigated with 6 L of saline and antibiotic impregnated solution. Gloves were changed. A new liner and head ball were impacted and the hip was reduced. The hip was stable, and soft tissue tension was appropriate. I closed the fascia  over a medium Hemovac drain with #1 PDS and 0 V-lock suture. Deep dermal layer was closed with 2-0 interrupted Monocryl sutures. Deep dermal layer was closed with 2-0 interrupted Monocryl sutures. The skin was closed with 2-0 nylon mattress sutures. A Prevena incisional wound VAC was applied.  The patient was extubated, and taken to the PACU in stable condition. Sponge, needle, and instrument counts were correct at the end of the case 2. There were no known complications.  POSTOPERATIVE PLAN:  We will admit the patient to the hospital. We will follow his cultures. We'll place him on IV Ancef and vancomycin until the cultures are finalized. He may weight-bear as tolerated. We'll follow his drain output. We will convert his incisional VAC to a portable home unit upon discharge. He will need to follow-up in the office 7 days after discharge for wound VAC removal.

## 2016-10-23 NOTE — Transfer of Care (Signed)
Immediate Anesthesia Transfer of Care Note  Patient: Zachary Watson  Procedure(s) Performed: IRRIGATION AND DEBRIDEMENT LEFT HIP WITH HEAD AND POLY EXCHANGE (Left Hip) APPLICATION OF WOUND VAC (Left Hip)  Patient Location: PACU  Anesthesia Type:General  Level of Consciousness: awake, alert  and oriented  Airway & Oxygen Therapy: Patient Spontanous Breathing  Post-op Assessment: Report given to RN and Post -op Vital signs reviewed and stable  Post vital signs: Reviewed and stable  Last Vitals:  Vitals:   10/23/16 0631  BP: (!) 92/58  Pulse: 64  Resp: 18  Temp: 36.6 C  SpO2: 95%    Last Pain:  Vitals:   10/23/16 0631  TempSrc: Oral      Patients Stated Pain Goal: 3 (10/23/16 0708)  Complications: No apparent anesthesia complications

## 2016-10-23 NOTE — Anesthesia Procedure Notes (Signed)
Procedure Name: Intubation Date/Time: 10/23/2016 7:42 AM Performed by: Clearnce Sorrel Pre-anesthesia Checklist: Patient identified, Emergency Drugs available, Suction available, Patient being monitored and Timeout performed Patient Re-evaluated:Patient Re-evaluated prior to induction Oxygen Delivery Method: Circle system utilized Preoxygenation: Pre-oxygenation with 100% oxygen Induction Type: IV induction Ventilation: Mask ventilation without difficulty Laryngoscope Size: Mac and 3 Grade View: Grade I Tube type: Oral Tube size: 7.5 mm Number of attempts: 1 Airway Equipment and Method: Stylet Placement Confirmation: ETT inserted through vocal cords under direct vision,  positive ETCO2 and breath sounds checked- equal and bilateral Secured at: 22 cm Tube secured with: Tape Dental Injury: Teeth and Oropharynx as per pre-operative assessment

## 2016-10-23 NOTE — H&P (Signed)
PREOPERATIVE H&P  Chief Complaint: WOUND  HPI: Zachary Watson is a 56 y.o. male who presents for preoperative history and physical with a diagnosis of a draining sinus tract, left hip. Patient has history of left total hip arthroplasty on 12/23/2015. He was doing well until about 2 weeks ago, when he began noticing erythema and warmth. He then developed a pustule that ruptured. [facility], and they started him on oral antibiotics. They then referred him to the office for evaluation. I recommended urgent operative intervention.  Past Medical History:  Diagnosis Date  . Anxiety   . Arthritis   . Chronic back pain   . Depression   . Gastric ulcer   . GERD (gastroesophageal reflux disease)   . Headache   . PTSD (post-traumatic stress disorder)   . PTSD (post-traumatic stress disorder)    Past Surgical History:  Procedure Laterality Date  . ANKLE SURGERY Right    fusion  . CLAVICLE SURGERY Left   . COLONOSCOPY    . ESOPHAGOGASTRODUODENOSCOPY    . KNEE ARTHROSCOPY Left   . SHOULDER ARTHROSCOPY Left   . TOTAL HIP ARTHROPLASTY Left 12/23/2015   Procedure: LEFT TOTAL HIP ARTHROPLASTY ANTERIOR APPROACH;  Surgeon: Samson FredericBrian Petula Rotolo, MD;  Location: [facility] OR;  Service: Orthopedics;  Laterality: Left;  . WRIST SURGERY Right    Social History   Social History  . Marital status: Single    Spouse name: N/A  . Number of children: N/A  . Years of education: N/A   Social History Main Topics  . Smoking status: Current Every Day Smoker    Packs/day: 1.50    Years: 40.00  . Smokeless tobacco: Former NeurosurgeonUser    Types: Chew  . Alcohol use Yes     Comment: occasionally  . Drug use: No  . Sexual activity: Not Asked   Other Topics Concern  . None   Social History Narrative  . None   Family History  Problem Relation Age of Onset  . Heart attack Mother    No Known Allergies Prior to Admission medications   Medication Sig Start Date End Date Taking? Authorizing Provider   cetirizine (ZYRTEC) 10 MG tablet Take 10 mg by mouth daily.   Yes [provider]  doxycycline (VIBRAMYCIN) 100 MG capsule Take 100 mg by mouth 2 (two) times daily.   Yes [provider]  escitalopram (LEXAPRO) 20 MG tablet Take 20 mg by mouth daily.   Yes [provider]  gabapentin (NEURONTIN) 300 MG capsule Take 300 mg by mouth 2 (two) times daily.   Yes [provider]  HYDROcodone-acetaminophen (NORCO) 7.5-325 MG tablet Take 1-2 tablets by mouth every 4 (four) hours as needed (breakthrough pain). Patient taking differently: Take 1-2 tablets by mouth 4 (four) times daily.  12/24/15  Yes Gizell Danser, Arlys JohnBrian, MD  methocarbamol (ROBAXIN) 750 MG tablet Take 750 mg by mouth 4 (four) times daily.    Yes [provider]     Positive ROS: All other systems have been reviewed and were otherwise negative with the exception of those mentioned in the HPI and as above.  Physical Exam: General: Alert, no acute distress Cardiovascular: No pedal edema Respiratory: No cyanosis, no use of accessory musculature GI: No organomegaly, abdomen is soft and non-tender Skin: No lesions in the area of chief complaint Neurologic: Sensation intact distally Psychiatric: Patient is competent for consent with normal mood and affect Lymphatic: No axillary or cervical lymphadenopathy  MUSCULOSKELETAL: Examination of  the left hip reveals that he has a 6 mm wound at the inferior aspect of his incision that is draining a small amount of seropurulent material. No significant pain with passive motion. He is neurovascularly intact.  Assessment: Periprosthetic joint infection, left total hip arthroplasty.  Plan: Plan for Procedure(s): IRRIGATION AND DEBRIDEMENT LEFT HIP WITH HEAD AND POLY EXCHANGE APPLICATION OF WOUND VAC  The risks benefits and alternatives were discussed with the patient including but not limited to the risks of nonoperative treatment, versus surgical  intervention including infection, bleeding, nerve injury,  blood clots, cardiopulmonary complications, morbidity, mortality, among others, and they were willing to proceed. The patient understands that if our attempt to sell his his implants should fail, then he will require a two-stage procedure.  Lindsea Olivar, Cloyde Reams, MD Cell [phone]   10/23/2016 7:27 AM

## 2016-10-23 NOTE — Progress Notes (Signed)
Initial Nutrition Assessment  DOCUMENTATION CODES:   Not applicable  INTERVENTION:  Discontinue Boost Breeze.   Provide Ensure Enlive po BID, each supplement provides 350 kcal and 20 grams of protein  Encourage adequate PO intake.   NUTRITION DIAGNOSIS:   Increased nutrient needs related to wound healing as evidenced by estimated needs.  GOAL:   Patient will meet greater than or equal to 90% of their needs  MONITOR:   PO intake, Supplement acceptance, Labs, Weight trends, Skin, I & O's  REASON FOR ASSESSMENT:   Malnutrition Screening Tool    ASSESSMENT:   56 y.o. male admitted on 10/23/16 with postoperative hip infection (from THA in 12/2015), now s/p I&D with placement of wound vac and suction drain. Pertinent PMH includes PTSD, GERD, depression, anxiety, arthritis.  PROCEDURE (10/19):   1. IRRIGATION AND DEBRIDEMENT LEFT HIP WITH HEAD BALL AND POLY LINER EXCHANGE 2. APPLICATION OF INCISIONAL WOUND VAC.  RD attempted to visit pt x 2, however pt unavailable. Diet has just been advanced to a regular diet. Pt currently has Boost Breeze ordered and has been consuming. RD to modify order as diet has advanced. Will order Ensure instead to aid in wound healing. Unable to complete Nutrition-Focused physical exam at this time. Labs and medications reviewed.   Diet Order:  Diet regular Room service appropriate? Yes; Fluid consistency: Thin  Skin:   (Incision on L hip)  Last BM:  10/17  Height:   Ht Readings from Last 1 Encounters:  10/23/16 5\' 11"  (1.803 m)    Weight:   Wt Readings from Last 1 Encounters:  10/23/16 185 lb (83.9 kg)    Ideal Body Weight:  78 kg  BMI:  Body mass index is 25.8 kg/m.  Estimated Nutritional Needs:   Kcal:  2200-2400  Protein:  110-120 grams  Fluid:  2.2 -2.4 L/day  EDUCATION NEEDS:   No education needs identified at this time  Roslyn SmilingStephanie Zhuri Krass, MS, RD, LDN Pager # [phone] After hours/ weekend pager # [phone]

## 2016-10-24 DIAGNOSIS — Z95828 Presence of other vascular implants and grafts: Secondary | ICD-10-CM

## 2016-10-24 DIAGNOSIS — T8452XD Infection and inflammatory reaction due to internal left hip prosthesis, subsequent encounter: Secondary | ICD-10-CM

## 2016-10-24 DIAGNOSIS — Z9689 Presence of other specified functional implants: Secondary | ICD-10-CM

## 2016-10-24 DIAGNOSIS — Y792 Prosthetic and other implants, materials and accessory orthopedic devices associated with adverse incidents: Secondary | ICD-10-CM

## 2016-10-24 LAB — C-REACTIVE PROTEIN: CRP: 3.1 mg/dL — ABNORMAL HIGH (ref <1.0)

## 2016-10-24 LAB — CBC WITH DIFFERENTIAL/PLATELET
Basophils Absolute: 0 10*3/uL (ref 0.0–0.1)
Basophils Relative: 1 %
EOS ABS: 0.2 10*3/uL (ref 0.0–0.7)
EOS PCT: 2 %
HCT: 27 % — ABNORMAL LOW (ref 39.0–52.0)
Hemoglobin: 9.2 g/dL — ABNORMAL LOW (ref 13.0–17.0)
Lymphocytes Relative: 19 %
Lymphs Abs: 1.4 10*3/uL (ref 0.7–4.0)
MCH: 29.2 pg (ref 26.0–34.0)
MCHC: 34.1 g/dL (ref 30.0–36.0)
MCV: 85.7 fL (ref 78.0–100.0)
MONO ABS: 0.6 10*3/uL (ref 0.1–1.0)
MONOS PCT: 8 %
Neutro Abs: 5 10*3/uL (ref 1.7–7.7)
Neutrophils Relative %: 70 %
PLATELETS: 188 10*3/uL (ref 150–400)
RBC: 3.15 MIL/uL — ABNORMAL LOW (ref 4.22–5.81)
RDW: 15.2 % (ref 11.5–15.5)
WBC: 7.1 10*3/uL (ref 4.0–10.5)

## 2016-10-24 LAB — BASIC METABOLIC PANEL
Anion gap: 5 (ref 5–15)
BUN: 6 mg/dL (ref 6–20)
CHLORIDE: 102 mmol/L (ref 101–111)
CO2: 26 mmol/L (ref 22–32)
CREATININE: 0.96 mg/dL (ref 0.61–1.24)
Calcium: 7.6 mg/dL — ABNORMAL LOW (ref 8.9–10.3)
GFR calc Af Amer: >60 mL/min (ref >60)
GFR calc non Af Amer: >60 mL/min (ref >60)
GLUCOSE: 133 mg/dL — AB (ref 65–99)
Potassium: 4 mmol/L (ref 3.5–5.1)
SODIUM: 133 mmol/L — AB (ref 135–145)

## 2016-10-24 LAB — SEDIMENTATION RATE: SED RATE: 19 mm/h — AB (ref 0–16)

## 2016-10-24 MED ORDER — SODIUM CHLORIDE 0.9% FLUSH
10.0000 mL | INTRAVENOUS | Status: DC | PRN
  Administered 2016-10-24 – 2016-10-26 (×2): 10 mL
  Filled 2016-10-24 (×2): qty 40

## 2016-10-24 NOTE — Progress Notes (Signed)
INFECTIOUS DISEASE PROGRESS NOTE  ID: Darci NeedleLawrence C Ingrum is a 56 y.o. male with  Principal Problem:   Infection of total joint prosthesis (HCC)  Subjective: No complaints  Abtx:  Anti-infectives    Start     Dose/Rate Route Frequency Ordered Stop   10/23/16 1600  vancomycin (VANCOCIN) IVPB 1000 mg/200 mL premix     1,000 mg 200 mL/hr over 60 Minutes Intravenous Every 8 hours 10/23/16 1259     10/23/16 1530  ceFAZolin (ANCEF) IVPB 1 g/50 mL premix     1 g 100 mL/hr over 30 Minutes Intravenous Every 8 hours 10/23/16 1259     10/23/16 1014  polymyxin B 500,000 Units, bacitracin 50,000 Units in sodium chloride 0.9 % 500 mL irrigation  Status:  Discontinued       As needed 10/23/16 1014 10/23/16 1101   10/23/16 0700  ceFAZolin (ANCEF) 3 g in dextrose 5 % 50 mL IVPB     3 g 130 mL/hr over 30 Minutes Intravenous On call to O.R. 10/23/16 [id number] 10/23/16 0744   10/23/16 0700  vancomycin (VANCOCIN) IVPB 1000 mg/200 mL premix     1,000 mg 200 mL/hr over 60 Minutes Intravenous On call to O.R. 10/23/16 [id number] 10/23/16 [id number]      Medications:  Scheduled: . docusate sodium  100 mg Oral BID  . enoxaparin (LOVENOX) injection  40 mg Subcutaneous Q24H  . escitalopram  20 mg Oral Daily  . feeding supplement (ENSURE ENLIVE)  237 mL Oral BID BM  . gabapentin  300 mg Oral BID  . senna  1 tablet Oral BID    Objective: Vital signs in last 24 hours: Temp:  [98.3 F (36.8 C)-99.2 F (37.3 C)] 99.2 F (37.3 C) (10/20 1415) Pulse Rate:  [88-96] 94 (10/20 1415) Resp:  [16-19] 19 (10/20 1415) BP: (90-95)/(49-53) 95/53 (10/20 1415) SpO2:  [94 %-96 %] 96 % (10/20 1415)   General appearance: alert, cooperative and no distress Incision/Wound: L hip dressed. Drain in place RUE PIC in place  Lab Results  Recent Labs  10/23/16 0644 10/24/16 0557  WBC 7.5 7.1  HGB 13.1 9.2*  HCT 38.5* 27.0*  NA 137 133*  K 4.0 4.0  CL 107 102  CO2 23 26  BUN 7 6  CREATININE 0.79 0.96   Liver Panel No  results for input(s): PROT, ALBUMIN, AST, ALT, ALKPHOS, BILITOT, BILIDIR, IBILI in the last 72 hours. Sedimentation Rate  Recent Labs  10/24/16 0557  ESRSEDRATE 19*   C-Reactive Protein  Recent Labs  10/24/16 0557  CRP 3.1*    Microbiology: Recent Results (from the past 240 hour(s))  Aerobic/Anaerobic Culture (surgical/deep wound)     Status: None (Preliminary result)   Collection Time: 10/23/16  8:13 AM  Result Value Ref Range Status   Specimen Description WOUND LEFT HIP  Final   Special Requests SWAB SPEC A  Final   Gram Stain   Final    MODERATE WBC PRESENT, PREDOMINANTLY PMN NO ORGANISMS SEEN    Culture NO GROWTH 1 DAY  Final   Report Status PENDING  Incomplete  Aerobic Culture (superficial specimen)     Status: None (Preliminary result)   Collection Time: 10/23/16  8:17 AM  Result Value Ref Range Status   Specimen Description TISSUE LEFT HIP PSEUDO CAPSULE  Final   Special Requests SPEC B  Final   Gram Stain   Final    MODERATE WBC PRESENT,BOTH PMN AND MONONUCLEAR NO ORGANISMS SEEN  Culture NO GROWTH 1 DAY  Final   Report Status PENDING  Incomplete    Studies/Results: Dg Pelvis Portable  Result Date: 10/23/2016 CLINICAL DATA:  56 year old male status post left hip replacement. EXAM: PORTABLE PELVIS 1-2 VIEWS COMPARISON:  Prior radiograph 12/23/2015 FINDINGS: Surgical changes of left hip arthroplasty are again noted. There is evidence of recent debridement with subcutaneous emphysema and the surgical drain in place. Additionally, there are very subtle lucencies in the acetabulum adjacent to the screw holes of the acetabular cup consistent with recent debridement. No evidence of periprosthetic fracture or hardware complication. The remaining pelvis is intact and unremarkable. IMPRESSION: Postsurgical changes of incision, drainage, debridement and lavage of the left hip arthroplasty prosthesis. No evidence of immediate complication. Electronically Signed   By: Malachy Moan M.D.   On: 10/23/2016 11:26     Assessment/Plan: L THR 12-2015 Sinus tract/wound breakdown   Wound Cx pending Will f/u Cx on 10-21, available on 10-21 as needed.  Continue current anbx  Total days of antibiotics: 1 vanco/ancef         Johny Sax MD, FACP Infectious Diseases (pager) [phone] [email or web address] 10/24/2016, 3:16 PM  LOS: 1 day

## 2016-10-24 NOTE — Progress Notes (Signed)
Peripherally Inserted Central Catheter/Midline Placement  The IV Nurse has discussed with the patient and/or persons authorized to consent for the patient, the purpose of this procedure and the potential benefits and risks involved with this procedure.  The benefits include less needle sticks, lab draws from the catheter, and the patient may be discharged home with the catheter. Risks include, but not limited to, infection, bleeding, blood clot (thrombus formation), and puncture of an artery; nerve damage and irregular heartbeat and possibility to perform a PICC exchange if needed/ordered by physician.  Alternatives to this procedure were also discussed.  Bard Power PICC patient education guide, fact sheet on infection prevention and patient information card has been provided to patient /or left at bedside.    PICC/Midline Placement Documentation  PICC Single Lumen 10/24/16 PICC Right Basilic 40 cm 0 cm (Active)  Indication for Insertion or Continuance of Line Prolonged intravenous therapies 10/24/2016 12:00 PM  Exposed Catheter (cm) 0 cm 10/24/2016 12:00 PM  Site Assessment Clean;Dry;Intact 10/24/2016 12:00 PM  Line Status Flushed;Blood return noted;Saline locked 10/24/2016 12:00 PM  Dressing Type Transparent;Occlusive 10/24/2016 12:00 PM  Dressing Status Clean;Dry;Intact;Antimicrobial disc in place 10/24/2016 12:00 PM  Line Care Connections checked and tightened 10/24/2016 12:00 PM  Line Adjustment (NICU/IV Team Only) No 10/24/2016 12:00 PM  Dressing Intervention New dressing 10/24/2016 12:00 PM  Dressing Change Due 10/31/16 10/24/2016 12:00 PM       Elliot Dallyiggs, Al Gagen Wright 10/24/2016, 12:42 PM

## 2016-10-24 NOTE — Progress Notes (Signed)
Pt called nurse to room, this nurse found dislodged.  Pt states "must have gotten caught in the chair."  No acute distress or injuries noted.  Wound vac remains intact. Oncoming shift/ charge nurse aware.  [id number]

## 2016-10-24 NOTE — Evaluation (Signed)
Occupational Therapy Evaluation Patient Details Name: Zachary Watson MRN: [id number] DOB: 1960/06/08 Today's Date: 10/24/2016    History of Present Illness Pt is a 56 y.o. male admitted on 10/23/16 with postoperative hip infection (from THA in 12/2015), now s/p I&D with placement of wound vac and suction drain. Pertinent PMH includes PTSD, GERD, depression, anxiety, arthritis, chronic LBP.    Clinical Impression   PTA, pt was living with his girlfriend and was requiring assistance for LB ADLs due to back and hip pain. Currently, pt requires Min guard A for LB ADLs with AE and for functional mobility. Provided education on LB ADLs with AE and toilet transfer with 3N1; pt demonstrated understanding. Pt would benefit from further acute OT to address tub transfers and facilitate safe dc. Recommend dc home with initial 24 hour supervision once medically stable per physician.     Follow Up Recommendations  No OT follow up;Supervision/Assistance - 24 hour    Equipment Recommendations  3 in 1 bedside commode    Recommendations for Other Services PT consult     Precautions / Restrictions Precautions Precautions: None Restrictions Weight Bearing Restrictions: Yes LLE Weight Bearing: Weight bearing as tolerated Other Position/Activity Restrictions: Wound vac      Mobility Bed Mobility               General bed mobility comments: Pt up in chair upon arrival  Transfers Overall transfer level: Needs assistance Equipment used: None Transfers: Sit to/from Stand Sit to Stand: Min guard         General transfer comment: Min Guard for safety    Balance Overall balance assessment: Needs assistance Sitting-balance support: No upper extremity supported;Feet supported;Feet unsupported Sitting balance-Leahy Scale: Good     Standing balance support: No upper extremity supported;During functional activity;Bilateral upper extremity supported Standing balance-Leahy Scale:  Fair Standing balance comment: Able to static stand with no UE support and supervision                            ADL either performed or assessed with clinical judgement   ADL Overall ADL's : Needs assistance/impaired Eating/Feeding: Set up;Supervision/ safety;Sitting   Grooming: Min guard;Standing   Upper Body Bathing: Set up;Supervision/ safety;Sitting   Lower Body Bathing: Minimal assistance;Sit to/from stand   Upper Body Dressing : Set up;Supervision/safety;Sitting   Lower Body Dressing: Min guard;With adaptive equipment;Sit to/from stand Lower Body Dressing Details (indicate cue type and reason): donned pants and socks with AE. Min Guard for safety in standing. educated pt on putting on pants with wound vac Toilet Transfer: BSC;Ambulation;Min guard Statistician Details (indicate cue type and reason): Min Guard for safety. BSC over toilet to elevate       Tub/Shower Transfer Details (indicate cue type and reason): Need education on tub transfer with 3n1 Functional mobility during ADLs: Min guard (IV pole) General ADL Comments: Pt performing LB ADLs at Min A level and funcitonal mobility with [employer]. Provided education for LB ADLs with AE     Vision Baseline Vision/History: Wears glasses Wears Glasses: Reading only Patient Visual Report: No change from baseline       Perception     Praxis      Pertinent Vitals/Pain Pain Assessment: 0-10 Pain Score: 10-Worst pain ever Pain Location: L hip Pain Descriptors / Indicators: Stabbing Pain Intervention(s): Monitored during session;Limited activity within patient's tolerance;Repositioned;Patient requesting pain meds-RN notified     Hand Dominance Right   Extremity/Trunk  Assessment Upper Extremity Assessment Upper Extremity Assessment: Overall WFL for tasks assessed   Lower Extremity Assessment Lower Extremity Assessment: Defer to PT evaluation   Cervical / Trunk Assessment Cervical / Trunk  Assessment: Other exceptions Cervical / Trunk Exceptions: increase lower back pain with bending   Communication Communication Communication: No difficulties   Cognition Arousal/Alertness: Awake/alert Behavior During Therapy: WFL for tasks assessed/performed Overall Cognitive Status: Within Functional Limits for tasks assessed                                     General Comments       Exercises     Shoulder Instructions      Home Living Family/patient expects to be discharged to:: Private residence Living Arrangements: Spouse/significant other Available Help at Discharge: Family;Friend(s);Available 24 hours/day Type of Home: Apartment Home Access: Level entry     Home Layout: One level     Bathroom Shower/Tub: Tub/shower unit;Curtain   Firefighter: Standard Bathroom Accessibility: Yes How Accessible: Accessible via walker Home Equipment: Cane - single point;Hand held shower head;Walker - 2 wheels;Adaptive equipment Adaptive Equipment: Reacher        Prior Functioning/Environment Level of Independence: Needs assistance  Gait / Transfers Assistance Needed: using SPC ADL's / Homemaking Assistance Needed: Girlfriend helpign with shoes and socks and sometimes pants (Girlfriend does IADLs)            OT Problem List: Decreased range of motion;Decreased activity tolerance;Impaired balance (sitting and/or standing);Decreased knowledge of use of DME or AE;Pain      OT Treatment/Interventions: Self-care/ADL training;Therapeutic exercise;Energy conservation;DME and/or AE instruction;Therapeutic activities;Patient/family education    OT Goals(Current goals can be found in the care plan section) Acute Rehab OT Goals Patient Stated Goal: Return home OT Goal Formulation: With patient Time For Goal Achievement: 11/07/16 Potential to Achieve Goals: Good ADL Goals Pt Will Perform Grooming: with modified independence;standing Pt Will Perform Lower Body  Dressing: with modified independence;with adaptive equipment;sit to/from stand Pt Will Transfer to Toilet: with modified independence;bedside commode;ambulating Pt Will Perform Tub/Shower Transfer: with supervision;with set-up;ambulating;3 in 1;Tub transfer  OT Frequency: Min 2X/week   Barriers to D/C:            Co-evaluation              AM-PAC PT "6 Clicks" Daily Activity     Outcome Measure Help from another person eating meals?: None Help from another person taking care of personal grooming?: A Little Help from another person toileting, which includes using toliet, bedpan, or urinal?: A Little Help from another person bathing (including washing, rinsing, drying)?: A Little Help from another person to put on and taking off regular upper body clothing?: None Help from another person to put on and taking off regular lower body clothing?: A Little 6 Click Score: 20   End of Session Nurse Communication: Mobility status;Patient requests pain meds  Activity Tolerance: Patient tolerated treatment well Patient left: in chair;with call bell/phone within reach;with nursing/sitter in room  OT Visit Diagnosis: Unsteadiness on feet (R26.81);Other abnormalities of gait and mobility (R26.89);Pain Pain - Right/Left: Left Pain - part of body: Hip                Time: 2130-8657 OT Time Calculation (min): 20 min Charges:  OT General Charges $OT Visit: 1 Visit OT Evaluation $OT Eval Moderate Complexity: 1 Mod G-Codes:     Amarah Brossman MSOT, OTR/L  Acute Rehab Pager: [phone] Office: [phone]  Theodoro GristCharis M Johnn Krasowski 10/24/2016, 10:13 AM

## 2016-10-24 NOTE — Progress Notes (Signed)
   Subjective:  Patient reports pain as mild to moderate.  Denies N/V/CP/SOBV.  Objective:   VITALS:   Vitals:   10/23/16 1205 10/23/16 1318 10/23/16 2240 10/24/16 0528  BP: (!) 89/76 (!) 96/57 (!) 90/53 (!) 94/49  Pulse: 86 80 88 96  Resp: 17   16  Temp:  98.2 F (36.8 C) 98.3 F (36.8 C) 99 F (37.2 C)  TempSrc:  Oral Oral Oral  SpO2: 98% 96% 96% 94%  Weight:      Height:       HV 200 cc in 24 hrs  NAD ABD soft Sensation intact distally Intact pulses distally Dorsiflexion/Plantar flexion intact Incision: dressing C/D/I Compartment soft Prevena intact HV ss   Lab Results  Component Value Date   WBC 7.1 10/24/2016   HGB 9.2 (L) 10/24/2016   HCT 27.0 (L) 10/24/2016   MCV 85.7 10/24/2016   PLT 188 10/24/2016   BMET    Component Value Date/Time   NA 133 (L) 10/24/2016 0557   K 4.0 10/24/2016 0557   CL 102 10/24/2016 0557   CO2 26 10/24/2016 0557   GLUCOSE 133 (H) 10/24/2016 0557   BUN 6 10/24/2016 0557   CREATININE 0.96 10/24/2016 0557   CALCIUM 7.6 (L) 10/24/2016 0557   GFRNONAA >60 10/24/2016 0557   GFRAA >60 10/24/2016 0557    Recent Results (from the past 240 hour(s))  Aerobic/Anaerobic Culture (surgical/deep wound)     Status: None (Preliminary result)   Collection Time: 10/23/16  8:13 AM  Result Value Ref Range Status   Specimen Description WOUND LEFT HIP  Final   Special Requests SWAB SPEC A  Final   Gram Stain   Final    MODERATE WBC PRESENT, PREDOMINANTLY PMN NO ORGANISMS SEEN    Culture PENDING  Incomplete   Report Status PENDING  Incomplete  Aerobic Culture (superficial specimen)     Status: None (Preliminary result)   Collection Time: 10/23/16  8:17 AM  Result Value Ref Range Status   Specimen Description TISSUE LEFT HIP PSEUDO CAPSULE  Final   Special Requests SPEC B  Final   Gram Stain   Final    MODERATE WBC PRESENT,BOTH PMN AND MONONUCLEAR NO ORGANISMS SEEN    Culture PENDING  Incomplete   Report Status PENDING  Incomplete       Assessment/Plan: 1 Day Post-Op   Principal Problem:   Infection of total joint prosthesis (HCC)   WBAT with walker DVT ppx: lovenox, SCDs, TEDs PO pain control Cont HV drain Cont Prevena ID: rec PICC line, appreciate recs Cont IV ancef and vanco for now Follow cultures Dispo: D/C HV drain when output < 30 cc/shift, keep patient in house until cultures final, will convert VAC to portable home unit upon d/c   Garnet KoyanagiSwinteck, Katalaya Beel James 10/24/2016, 10:41 AM   Samson FredericBrian Mercedes Valeriano, MD Cell [phone]

## 2016-10-25 LAB — CBC WITH DIFFERENTIAL/PLATELET
BASOS ABS: 0 10*3/uL (ref 0.0–0.1)
Basophils Relative: 0 %
EOS PCT: 3 %
Eosinophils Absolute: 0.2 10*3/uL (ref 0.0–0.7)
HEMATOCRIT: 24.7 % — AB (ref 39.0–52.0)
HEMOGLOBIN: 8.3 g/dL — AB (ref 13.0–17.0)
LYMPHS PCT: 22 %
Lymphs Abs: 1.6 10*3/uL (ref 0.7–4.0)
MCH: 28.6 pg (ref 26.0–34.0)
MCHC: 33.6 g/dL (ref 30.0–36.0)
MCV: 85.2 fL (ref 78.0–100.0)
Monocytes Absolute: 0.5 10*3/uL (ref 0.1–1.0)
Monocytes Relative: 7 %
NEUTROS PCT: 68 %
Neutro Abs: 4.8 10*3/uL (ref 1.7–7.7)
PLATELETS: 150 10*3/uL (ref 150–400)
RBC: 2.9 MIL/uL — AB (ref 4.22–5.81)
RDW: 14.5 % (ref 11.5–15.5)
WBC: 7.2 10*3/uL (ref 4.0–10.5)

## 2016-10-25 LAB — BASIC METABOLIC PANEL
ANION GAP: 3 — AB (ref 5–15)
BUN: <5 mg/dL — ABNORMAL LOW (ref 6–20)
CHLORIDE: 105 mmol/L (ref 101–111)
CO2: 28 mmol/L (ref 22–32)
Calcium: 7.6 mg/dL — ABNORMAL LOW (ref 8.9–10.3)
Creatinine, Ser: 0.89 mg/dL (ref 0.61–1.24)
GFR calc Af Amer: >60 mL/min (ref >60)
GLUCOSE: 115 mg/dL — AB (ref 65–99)
Potassium: 3.6 mmol/L (ref 3.5–5.1)
Sodium: 136 mmol/L (ref 135–145)

## 2016-10-25 LAB — VANCOMYCIN, TROUGH: Vancomycin Tr: 11 ug/mL — ABNORMAL LOW (ref 15–20)

## 2016-10-25 MED ORDER — VANCOMYCIN HCL 10 G IV SOLR
1250.0000 mg | Freq: Three times a day (TID) | INTRAVENOUS | Status: DC
  Administered 2016-10-25 – 2016-10-26 (×3): 1250 mg via INTRAVENOUS
  Filled 2016-10-25 (×5): qty 1250

## 2016-10-25 NOTE — Progress Notes (Signed)
Pharmacy Antibiotic Note  Zachary Watson is a 56 y.o. male admitted on 10/23/2016 with prosthetic joint infection of left hip s/p I&D.  He had left THA 12/23/15. Pharmacy has been consulted for vancomycin and cefazolin dosing. Renal function is normal.   Vancomycin trough drawn ~45 min late was 11 (true trough ~11.7). Renal function stable.  Plan: Increase vancomycin to 1250 mg IV q8h Continue cefazolin 1 g IV q8h Monitor renal function, clinical progress, and culture data Vancomycin trough at Css  Height: 5\' 11"  (180.3 cm) Weight: 185 lb (83.9 kg) IBW/kg (Calculated) : 75.3  Temp (24hrs), Avg:99.3 F (37.4 C), Min:98.9 F (37.2 C), Max:100 F (37.8 C)   Recent Labs Lab 10/23/16 0644 10/24/16 0557 10/25/16 0406 10/25/16 0845  WBC 7.5 7.1 7.2  --   CREATININE 0.79 0.96 0.89  --   VANCOTROUGH  --   --   --  11*    Estimated Creatinine Clearance: 98.7 mL/min (by C-G formula based on SCr of 0.89 mg/dL).    No Known Allergies  Antimicrobials this admission: Vancomycin 10/19 >>  Cefazolin 10/19 >>   Dose adjustments this admission:   Microbiology results: 10/19 left hip tissue: ngtd   Thank you for allowing pharmacy to be a part of this patient's care.  Loura BackJennifer Quamba, PharmD, BCPS Clinical Pharmacist Phone for today [phone] Main pharmacy - [phone] 10/25/2016 10:32 AM

## 2016-10-25 NOTE — Progress Notes (Signed)
Subjective: 2 Days Post-Op Procedure(s) (LRB): IRRIGATION AND DEBRIDEMENT LEFT HIP WITH HEAD AND POLY EXCHANGE (Left) APPLICATION OF WOUND VAC (Left) Patient reports pain as moderate.  Drain came out overnight.  Tolerating regular diet.  On IV abx.  Objective: Vital signs in last 24 hours: Temp:  [98.9 F (37.2 C)-100 F (37.8 C)] 98.9 F (37.2 C) (10/21 0501) Pulse Rate:  [89-94] 89 (10/21 0501) Resp:  [16-19] 16 (10/21 0501) BP: (95-105)/(53-62) 97/57 (10/21 0501) SpO2:  [93 %-96 %] 93 % (10/21 0501)  Intake/Output from previous day: 10/20 0701 - 10/21 0700 In: 3820 [P.O.:3360; I.V.:10; IV Piggyback:450] Out: 1045 [Urine:1000; Drains:45] Intake/Output this shift: Total I/O In: 480 [P.O.:480] Out: 400 [Urine:400]   Recent Labs  10/23/16 0644 10/24/16 0557 10/25/16 0406  HGB 13.1 9.2* 8.3*    Recent Labs  10/24/16 0557 10/25/16 0406  WBC 7.1 7.2  RBC 3.15* 2.90*  HCT 27.0* 24.7*  PLT 188 150    Recent Labs  10/24/16 0557 10/25/16 0406  NA 133* 136  K 4.0 3.6  CL 102 105  CO2 26 28  BUN 6 <5*  CREATININE 0.96 0.89  GLUCOSE 133* 115*  CALCIUM 7.6* 7.6*   No results for input(s): LABPT, INR in the last 72 hours.  PE:  wn wd male in nad.  L hip wound dressed and dry.  Vac in place.  NVI at L LE.  Drain site dressed and dry.  Cxs:  No growth to date.  Assessment/Plan: 2 Days Post-Op Procedure(s) (LRB): IRRIGATION AND DEBRIDEMENT LEFT HIP WITH HEAD AND POLY EXCHANGE (Left) APPLICATION OF WOUND VAC (Left) Continue vac and IV abx.  WBAT on L LE.  Nalla Purdy 10/25/2016, 9:47 AM

## 2016-10-25 NOTE — Progress Notes (Signed)
Occupational Therapy Treatment Patient Details Name: Zachary Watson MRN: [id number] DOB: 07-22-1960 Today's Date: 10/25/2016    History of present illness Pt is a 56 y.o. male admitted on 10/23/16 with postoperative hip infection (from [place] in 12/2015), now s/p I&D with placement of wound vac and suction drain. Pertinent PMH includes PTSD, GERD, depression, anxiety, arthritis, chronic LBP.    OT comments  Pt progressing towards established OT goals. Provided education on tub transfer with 3N1. Pt performed tub transfer with Min guard for safety. Reviewed LB ADLs techniques to optimize safety and independence. Answered all pt's questions. Continues to recommend dc once medically stable per physician. All acute OT needs met and will sign off. Thank you.    Follow Up Recommendations  No OT follow up;Supervision/Assistance - 24 hour    Equipment Recommendations  3 in 1 bedside commode    Recommendations for Other Services PT consult    Precautions / Restrictions Precautions Precautions: None Restrictions Weight Bearing Restrictions: Yes LLE Weight Bearing: Weight bearing as tolerated Other Position/Activity Restrictions: Wound vac       Mobility Bed Mobility               General bed mobility comments: Pt up in chair upon arrival  Transfers Overall transfer level: Needs assistance Equipment used: None Transfers: Sit to/from Stand Sit to Stand: Supervision         General transfer comment: Supervision for safety    Balance Overall balance assessment: Needs assistance Sitting-balance support: No upper extremity supported;Feet supported;Feet unsupported Sitting balance-Leahy Scale: Good     Standing balance support: No upper extremity supported;During functional activity;Bilateral upper extremity supported Standing balance-Leahy Scale: Fair Standing balance comment: Able to static stand with no UE support and supervision                            ADL  either performed or assessed with clinical judgement   ADL Overall ADL's : Needs assistance/impaired                       Lower Body Dressing Details (indicate cue type and reason): Reviewed LB dressing techniques and managament of wound vac         Tub/ Shower Transfer: Tub transfer;Min guard;3 in 1   Functional mobility during ADLs: Min guard (IV pole) General ADL Comments: Provided education on safe tub transfer techniques with 3N1. Pt demonstrating understanding. Reviewed LB ADLs. Answered all pt's questions     Vision       Perception     Praxis      Cognition Arousal/Alertness: Awake/alert Behavior During Therapy: WFL for tasks assessed/performed Overall Cognitive Status: Within Functional Limits for tasks assessed                                          Exercises     Shoulder Instructions       General Comments      Pertinent Vitals/ Pain       Pain Assessment: Faces Faces Pain Scale: Hurts little more Pain Location: L hip Pain Descriptors / Indicators: Stabbing Pain Intervention(s): Monitored during session;Repositioned  Home Living  Prior Functioning/Environment              Frequency  Min 2X/week        Progress Toward Goals  OT Goals(current goals can now be found in the care plan section)  Progress towards OT goals: Progressing toward goals  Acute Rehab OT Goals Patient Stated Goal: Return home OT Goal Formulation: With patient Time For Goal Achievement: 11/07/16 Potential to Achieve Goals: Good ADL Goals Pt Will Perform Grooming: with modified independence;standing Pt Will Perform Lower Body Dressing: with modified independence;with adaptive equipment;sit to/from stand Pt Will Transfer to Toilet: with modified independence;bedside commode;ambulating Pt Will Perform Tub/Shower Transfer: with supervision;with set-up;ambulating;3 in 1;Tub transfer   Plan Discharge plan remains appropriate    Co-evaluation                 AM-PAC PT "6 Clicks" Daily Activity     Outcome Measure   Help from another person eating meals?: None Help from another person taking care of personal grooming?: A Little Help from another person toileting, which includes using toliet, bedpan, or urinal?: A Little Help from another person bathing (including washing, rinsing, drying)?: A Little Help from another person to put on and taking off regular upper body clothing?: None Help from another person to put on and taking off regular lower body clothing?: A Little 6 Click Score: 20    End of Session Equipment Utilized During Treatment: Gait belt  OT Visit Diagnosis: Unsteadiness on feet (R26.81);Other abnormalities of gait and mobility (R26.89);Pain Pain - Right/Left: Left Pain - part of body: Hip   Activity Tolerance Patient tolerated treatment well   Patient Left in chair;with call bell/phone within reach;with nursing/sitter in room   Nurse Communication Mobility status        Time: 6578-4696 OT Time Calculation (min): 22 min  Charges: OT General Charges $OT Visit: 1 Visit OT Treatments $Self Care/Home Management : 8-22 mins  [place], OTR/L Acute Rehab Pager: [phone] Office: [place] 10/25/2016, 12:25 PM

## 2016-10-26 ENCOUNTER — Encounter (HOSPITAL_COMMUNITY): Payer: Self-pay | Admitting: Orthopedic Surgery

## 2016-10-26 LAB — CBC WITH DIFFERENTIAL/PLATELET
BASOS ABS: 0 10*3/uL (ref 0.0–0.1)
BASOS PCT: 0 %
EOS ABS: 0.2 10*3/uL (ref 0.0–0.7)
Eosinophils Relative: 3 %
HCT: 24.3 % — ABNORMAL LOW (ref 39.0–52.0)
HEMOGLOBIN: 8.2 g/dL — AB (ref 13.0–17.0)
Lymphocytes Relative: 21 %
Lymphs Abs: 1.5 10*3/uL (ref 0.7–4.0)
MCH: 28.7 pg (ref 26.0–34.0)
MCHC: 33.7 g/dL (ref 30.0–36.0)
MCV: 85 fL (ref 78.0–100.0)
Monocytes Absolute: 0.6 10*3/uL (ref 0.1–1.0)
Monocytes Relative: 8 %
NEUTROS PCT: 68 %
Neutro Abs: 5 10*3/uL (ref 1.7–7.7)
Platelets: 159 10*3/uL (ref 150–400)
RBC: 2.86 MIL/uL — AB (ref 4.22–5.81)
RDW: 14.7 % (ref 11.5–15.5)
WBC: 7.4 10*3/uL (ref 4.0–10.5)

## 2016-10-26 LAB — BASIC METABOLIC PANEL
Anion gap: 6 (ref 5–15)
BUN: <5 mg/dL — ABNORMAL LOW (ref 6–20)
CALCIUM: 7.7 mg/dL — AB (ref 8.9–10.3)
CHLORIDE: 103 mmol/L (ref 101–111)
CO2: 27 mmol/L (ref 22–32)
CREATININE: 0.82 mg/dL (ref 0.61–1.24)
GFR calc non Af Amer: >60 mL/min (ref >60)
Glucose, Bld: 111 mg/dL — ABNORMAL HIGH (ref 65–99)
Potassium: 3.8 mmol/L (ref 3.5–5.1)
SODIUM: 136 mmol/L (ref 135–145)

## 2016-10-26 LAB — AEROBIC CULTURE  (SUPERFICIAL SPECIMEN): CULTURE: NO GROWTH

## 2016-10-26 LAB — AEROBIC CULTURE W GRAM STAIN (SUPERFICIAL SPECIMEN)

## 2016-10-26 MED ORDER — CEFTRIAXONE IV (FOR PTA / DISCHARGE USE ONLY): 2.0000 g | INTRAVENOUS | 0 refills | Status: DC

## 2016-10-26 MED ORDER — ONDANSETRON HCL 4 MG PO TABS: 4.0000 mg | ORAL_TABLET | Freq: Four times a day (QID) | ORAL | 0 refills | Status: DC | PRN

## 2016-10-26 MED ORDER — DEXTROSE 5 % IV SOLN
2.0000 g | INTRAVENOUS | Status: DC
  Administered 2016-10-26 – 2016-10-27 (×2): 2 g via INTRAVENOUS
  Filled 2016-10-26 (×3): qty 2

## 2016-10-26 MED ORDER — DOCUSATE SODIUM 100 MG PO CAPS: 100.0000 mg | ORAL_CAPSULE | Freq: Two times a day (BID) | ORAL | 1 refills | Status: DC

## 2016-10-26 MED ORDER — VANCOMYCIN IV (FOR PTA / DISCHARGE USE ONLY): 1250.0000 mg | Freq: Three times a day (TID) | INTRAVENOUS | 0 refills | Status: DC

## 2016-10-26 MED ORDER — ASPIRIN 81 MG PO TABS: 81.0000 mg | ORAL_TABLET | Freq: Two times a day (BID) | ORAL | 1 refills | Status: AC

## 2016-10-26 MED ORDER — SENNA 8.6 MG PO TABS: 1.0000 | ORAL_TABLET | Freq: Two times a day (BID) | ORAL | 0 refills | Status: DC

## 2016-10-26 MED ORDER — HYDROCODONE-ACETAMINOPHEN 10-325 MG PO TABS: 1.0000 | ORAL_TABLET | Freq: Four times a day (QID) | ORAL | 0 refills | Status: AC | PRN

## 2016-10-26 MED ORDER — VANCOMYCIN HCL 10 G IV SOLR
1250.0000 mg | Freq: Three times a day (TID) | INTRAVENOUS | Status: DC
  Administered 2016-10-26 – 2016-10-27 (×2): 1250 mg via INTRAVENOUS
  Filled 2016-10-26 (×3): qty 1250

## 2016-10-26 NOTE — Progress Notes (Signed)
   Subjective:  Patient reports pain as mild to moderate.  Denies N/V/CP/SOBV.  Objective:   VITALS:   Vitals:   10/25/16 0501 10/25/16 1300 10/25/16 2031 10/26/16 0538  BP: (!) 97/57 (!) 94/55 (!) 95/58 102/63  Pulse: 89 (!) 51 71 81  Resp: 16 16 16 16   Temp: 98.9 F (37.2 C) 99.2 F (37.3 C) 99 F (37.2 C) 98.3 F (36.8 C)  TempSrc: Oral Oral Oral Oral  SpO2: 93% 92% 99% 97%  Weight:      Height:        NAD ABD soft Sensation intact distally Intact pulses distally Dorsiflexion/Plantar flexion intact Incision: dressing C/D/I Compartment soft Prevena intact   Lab Results  Component Value Date   WBC 7.4 10/26/2016   HGB 8.2 (L) 10/26/2016   HCT 24.3 (L) 10/26/2016   MCV 85.0 10/26/2016   PLT 159 10/26/2016   BMET    Component Value Date/Time   NA 136 10/26/2016 0442   K 3.8 10/26/2016 0442   CL 103 10/26/2016 0442   CO2 27 10/26/2016 0442   GLUCOSE 111 (H) 10/26/2016 0442   BUN <5 (L) 10/26/2016 0442   CREATININE 0.82 10/26/2016 0442   CALCIUM 7.7 (L) 10/26/2016 0442   GFRNONAA >60 10/26/2016 0442   GFRAA >60 10/26/2016 0442    Recent Results (from the past 240 hour(s))  Aerobic/Anaerobic Culture (surgical/deep wound)     Status: None (Preliminary result)   Collection Time: 10/23/16  8:13 AM  Result Value Ref Range Status   Specimen Description WOUND LEFT HIP  Final   Special Requests SWAB SPEC A  Final   Gram Stain   Final    MODERATE WBC PRESENT, PREDOMINANTLY PMN NO ORGANISMS SEEN    Culture NO GROWTH 2 DAYS HOLDING FOR POSSIBLE ANAEROBE  Final   Report Status PENDING  Incomplete  Aerobic Culture (superficial specimen)     Status: None (Preliminary result)   Collection Time: 10/23/16  8:17 AM  Result Value Ref Range Status   Specimen Description TISSUE LEFT HIP PSEUDO CAPSULE  Final   Special Requests SPEC B  Final   Gram Stain   Final    MODERATE WBC PRESENT,BOTH PMN AND MONONUCLEAR NO ORGANISMS SEEN    Culture NO GROWTH 2 DAYS  Final     Report Status PENDING  Incomplete     Assessment/Plan: 3 Days Post-Op   Principal Problem:   Infection of total joint prosthesis (HCC)   WBAT with walker DVT ppx: lovenox, SCDs, TEDs PO pain control ABLA: asymptomatic, monitor Cont Prevena ID: PICC placed, appreciate recs Cont IV ancef and vanco for now Follow cultures Dispo: awaiting culture results, will convert VAC to portable home unit upon d/c   Garnet KoyanagiSwinteck, Aliese Brannum James 10/26/2016, 9:46 AM   Samson FredericBrian Barbette Mcglaun, MD Cell [phone]

## 2016-10-26 NOTE — Progress Notes (Addendum)
[facility] for Infectious Disease    Date of Admission:  10/23/2016   Total days of antibiotics 5        Day 5 vanco/cefazolin           ID: Zachary Watson is a 56 y.o. male with left hip pji. cx negative Principal Problem:   Infection of total joint prosthesis (HCC)    Subjective: Afebrile. Looking forward to going home  Medications:  . docusate sodium  100 mg Oral BID  . enoxaparin (LOVENOX) injection  40 mg Subcutaneous Q24H  . escitalopram  20 mg Oral Daily  . feeding supplement (ENSURE ENLIVE)  237 mL Oral BID BM  . gabapentin  300 mg Oral BID  . senna  1 tablet Oral BID    Objective: Vital signs in last 24 hours: Temp:  [98.1 F (36.7 C)-99 F (37.2 C)] 98.1 F (36.7 C) (10/22 1250) Pulse Rate:  [71-81] 76 (10/22 1250) Resp:  [16-18] 18 (10/22 1250) BP: (95-102)/(53-63) 95/53 (10/22 1250) SpO2:  [97 %-99 %] 99 % (10/22 1250)  Physical Exam  Constitutional: He is oriented to person, place, and time. He appears well-developed and well-nourished. No distress.  HENT:  Mouth/Throat: Oropharynx is clear and moist. No oropharyngeal exudate.  Cardiovascular: Normal rate, regular rhythm and normal heart sounds. Exam reveals no gallop and no friction rub.  No murmur heard.  Pulmonary/Chest: Effort normal and breath sounds normal. No respiratory distress. He has no wheezes.  Ext: left hip wound vac inplace. Right arm picc line is c/d/i Neurological: He is alert and oriented to person, place, and time.  Skin: Skin is warm and dry. No rash noted. No erythema.  Psychiatric: He has a normal mood and affect. His behavior is normal.     Lab Results  Recent Labs  10/25/16 0406 10/26/16 0442  WBC 7.2 7.4  HGB 8.3* 8.2*  HCT 24.7* 24.3*  NA 136 136  K 3.6 3.8  CL 105 103  CO2 28 27  BUN <5* <5*  CREATININE 0.89 0.82   Sedimentation Rate  Recent Labs  10/24/16 0557  ESRSEDRATE 19*   C-Reactive Protein  Recent Labs  10/24/16 0557  CRP 3.1*     Microbiology: 10/19 aerobic cx- reincubating Studies/Results: No results found.   Assessment/Plan: Left hip pji s/p washout = continue on vancomycin plus will change cefazolin to ceftriaxone and plan from 6 wk using 10/20 as day 1. Strep species growing from one of the bottles -- found to be have group b strep.   We will plan to still cover from MRSA in case his preceding oral abx had masked cultures.  We will plan for vancomycin IV and  ceftriaxone 2gm IV daily x 6 wk  Home health orders listed below  Diagnosis: pji  Culture Result: pending  No Known Allergies  OPAT Orders Discharge antibiotics: Per pharmacy protocol ceftriaxone 2gm IV daily  Plus vancomycin per protocol Duration: 6 wk End Date: Dec 1st  [place] Per Protocol:  Labs weekly while on IV antibiotics: _x_ CBC with differential x__ BMP twice per week __x CRP _x_ ESR _ X_  vanco trough  __x  Please pull PIC at completion of IV antibiotics   Fax weekly labs to [phone]  Clinic Follow Up Appt: In 5-6 wk  @ [place], [facility] for Infectious Diseases Cell: [phone] Pager: [phone]  10/26/2016, 2:05 PM

## 2016-10-26 NOTE — Progress Notes (Addendum)
PHARMACY CONSULT NOTE FOR:  OUTPATIENT  PARENTERAL ANTIBIOTIC THERAPY (OPAT)  Indication: Left Hip Prosthetic Joint Infection  Regimen: Ceftriaxone 2 gm every 24 hours + Vancomycin 1250 mg every 8 hours End date: 12/05/16  IV antibiotic discharge orders are entered.   Thank you for allowing pharmacy to be a part of this patient's care.  Della GooEmily S Laurina Fischl, PharmD PGY2 Infectious Diseases Pharmacy Resident Pager: [phone]  10/26/2016, 4:06 PM

## 2016-10-26 NOTE — Progress Notes (Signed)
PT Cancellation Note  Patient Details Name: Zachary Watson MRN: [id number] DOB: 09/05/1960   Cancelled Treatment:    Reason Eval/Treat Not Completed: Pain limiting ability to participate. Pt reports "ive been waiting for an hour and a half." Attempted to call RN, RN off floor. Marylene LandAngela, director provided pt with pain meds. PT to return as able to work with pt as pt with pain 10+/10.   Skila Rollins M Autumm Hattery 10/26/2016, 10:20 AM   Lewis ShockAshly Arhum Peeples, PT, DPT Pager #: [phone] Office #: [phone]

## 2016-10-26 NOTE — Progress Notes (Signed)
PT Cancellation Note  Patient Details Name: Zachary Watson MRN: [id number] DOB: 10/30/1960   Cancelled Treatment:     Pt very irritated about nursing care today and pain being high and not under control. Pt very anxious to get out hospital today and reports "I'm really okay. I can walk" "Ive been walking to the bathroom"   Pt reports pain to also be from stiffness. Pt educated on quad sets, glut sets and active L hip/knee flexion while in chair/bed to assist with ROM and decrease stiffness. Acute PT to return as able to progress indep with mobility.   Talbert Trembath M Masha Orbach 10/26/2016, 2:37 PM   Lewis ShockAshly Saahas Hidrogo, PT, DPT Pager #: [phone] Office #: [phone]

## 2016-10-26 NOTE — Discharge Summary (Signed)
Physician Discharge Summary  Patient ID: Zachary Watson MRN: [id number] DOB/AGE: 56-Nov-1962 25 y.o.  Admit date: 10/23/2016 Discharge date: 10/28/2016  Admission Diagnoses:  Infection of total joint prosthesis [facility])  Discharge Diagnoses:  Principal Problem:   Infection of total joint prosthesis [facility])   Past Medical History:  Diagnosis Date  . Anxiety   . Arthritis    "qwhere" (10/23/2016)  . Chronic lower back pain   . Chronic neck pain   . Depression   . Gastric ulcer   . GERD (gastroesophageal reflux disease)   . Migraine    "@ least weekly; chiropractor helps them" (10/23/2016)  . PTSD (post-traumatic stress disorder)   . PTSD (post-traumatic stress disorder)     Surgeries: Procedure(s): IRRIGATION AND DEBRIDEMENT LEFT HIP WITH HEAD AND POLY EXCHANGE APPLICATION OF WOUND VAC on 10/23/2016   Consultants (if any):   Discharged Condition: Improved  Hospital Course: Zachary Watson is an 56 y.o. male who was admitted 10/23/2016 with a diagnosis of Infection of total joint prosthesis ([place]) and went to the operating room on 10/23/2016 and underwent the above named procedures.    He was given perioperative antibiotics:  Anti-infectives    Start     Dose/Rate Route Frequency Ordered Stop   10/27/16 1100  vancomycin (VANCOCIN) 1,500 mg in sodium chloride 0.9 % 500 mL IVPB  Status:  Discontinued     1,500 mg 250 mL/hr over 120 Minutes Intravenous Every 12 hours 10/27/16 0844 10/28/16 1052   10/27/16 0000  vancomycin IVPB  Status:  Discontinued     1,500 mg Intravenous Every 12 hours 10/27/16 0855 10/27/16    10/27/16 0000  cefTRIAXone (ROCEPHIN) IVPB     2 g Intravenous Every 24 hours 10/27/16 0855     10/27/16 0000  vancomycin IVPB     1,500 mg Intravenous Every 12 hours 10/27/16 0855 12/06/16 2359   10/27/16 0000  vancomycin 1,500 mg in sodium chloride 0.9 % 500 mL    Comments:  Indication:  Prosthetic Hip Infection  Last Day of Therapy:  12/05/16 Labs -  Sunday/Monday:  CBC/D, BMP, and vancomycin trough. Labs - Thursday:  BMP and vancomycin trough Labs - Every other week:  ESR and CRP   1,500 mg 250 mL/hr over 120 Minutes Intravenous Every 12 hours 10/27/16 0857     10 /22/18 1800  vancomycin (VANCOCIN) 1,250 mg in sodium chloride 0.9 % 250 mL IVPB  Status:  Discontinued     1,250 mg 166.7 mL/hr over 90 Minutes Intravenous Every 8 hours 10/26/16 1600 10/27/16 0834   10/26/16 1015  cefTRIAXone (ROCEPHIN) 2 g in dextrose 5 % 50 mL IVPB  Status:  Discontinued     2 g 100 mL/hr over 30 Minutes Intravenous Every 24 hours 10/26/16 1001 10/28/16 1052   10/26/16 0000  cefTRIAXone (ROCEPHIN) IVPB  Status:  Discontinued     2 g Intravenous Every 24 hours 10/26/16 1604 10/27/16    10/26/16 0000  vancomycin IVPB  Status:  Discontinued     1,250 mg Intravenous Every 8 hours 10/26/16 1604 10/27/16    10/25/16 1600  vancomycin (VANCOCIN) 1,250 mg in sodium chloride 0.9 % 250 mL IVPB  Status:  Discontinued     1,250 mg 166.7 mL/hr over 90 Minutes Intravenous Every 8 hours 10/25/16 1035 10/26/16 1448   10/23/16 1600  vancomycin (VANCOCIN) IVPB 1000 mg/200 mL premix  Status:  Discontinued     1,000 mg 200 mL/hr over 60 Minutes Intravenous Every 8 hours  10/23/16 1259 10/25/16 1035   10/23/16 1530  ceFAZolin (ANCEF) IVPB 1 g/50 mL premix  Status:  Discontinued     1 g 100 mL/hr over 30 Minutes Intravenous Every 8 hours 10/23/16 1259 10/26/16 1001   10/23/16 1014  polymyxin B 500,000 Units, bacitracin 50,000 Units in sodium chloride 0.9 % 500 mL irrigation  Status:  Discontinued       As needed 10/23/16 1014 10/23/16 1101   10/23/16 0700  ceFAZolin (ANCEF) 3 g in dextrose 5 % 50 mL IVPB     3 g 130 mL/hr over 30 Minutes Intravenous On call to O.R. 10/23/16 7989 10/23/16 0744   10/23/16 0700  vancomycin (VANCOCIN) IVPB 1000 mg/200 mL premix     1,000 mg 200 mL/hr over 60 Minutes Intravenous On call to O.R. 10/23/16 2119 10/23/16 4174    .  He was given  sequential compression devices, early ambulation, and lovenox for DVT prophylaxis.  Infectious disease evaluated the patient. PICC line was placed. IV antibiotics were given. Discharge recommendations were IV vancomycin and ceftriaxone.  He benefited maximally from the hospital stay and there were no complications.    Recent vital signs:  Vitals:   10/27/16 1908 10/28/16 0614  BP: 107/62 114/68  Pulse: 72 86  Resp: 18 20  Temp: 98.5 F (36.9 C) 98.6 F (37 C)  SpO2: 96% 100%    Recent laboratory studies:  Lab Results  Component Value Date   HGB 8.2 (L) 10/26/2016   HGB 8.3 (L) 10/25/2016   HGB 9.2 (L) 10/24/2016   Lab Results  Component Value Date   WBC 7.4 10/26/2016   PLT 159 10/26/2016   No results found for: INR Lab Results  Component Value Date   NA 136 10/26/2016   K 3.8 10/26/2016   CL 103 10/26/2016   CO2 27 10/26/2016   BUN <5 (L) 10/26/2016   CREATININE 0.82 10/26/2016   GLUCOSE 111 (H) 10/26/2016   Recent Results (from the past 240 hour(s))  Aerobic/Anaerobic Culture (surgical/deep wound)     Status: None (Preliminary result)   Collection Time: 10/23/16  8:13 AM  Result Value Ref Range Status   Specimen Description WOUND LEFT HIP  Final   Special Requests SWAB SPEC A  Final   Gram Stain   Final    MODERATE WBC PRESENT, PREDOMINANTLY PMN NO ORGANISMS SEEN    Culture   Final    RARE GROUP B STREP(S.AGALACTIAE)ISOLATED TESTING AGAINST S. AGALACTIAE NOT ROUTINELY PERFORMED DUE TO PREDICTABILITY OF AMP/PEN/VAN SUSCEPTIBILITY. CRITICAL RESULT CALLED TO, READ BACK BY AND VERIFIED WITH: C SNIDER,MD AT 1444 10/26/16 BY L BENFIELD CONCERNING GROWTH ON CULTURE NO ANAEROBES ISOLATED; CULTURE IN PROGRESS FOR 5 DAYS    Report Status PENDING  Incomplete  Aerobic Culture (superficial specimen)     Status: None   Collection Time: 10/23/16  8:17 AM  Result Value Ref Range Status   Specimen Description TISSUE LEFT HIP PSEUDO CAPSULE  Final   Special Requests  SPEC B  Final   Gram Stain   Final    MODERATE WBC PRESENT,BOTH PMN AND MONONUCLEAR NO ORGANISMS SEEN    Culture NO GROWTH 2 DAYS  Final   Report Status 10/26/2016 FINAL  Final      Discharge Medications:   Allergies as of 10/28/2016   No Known Allergies     Medication List    STOP taking these medications   doxycycline 100 MG capsule Commonly known as:  VIBRAMYCIN   HYDROcodone-acetaminophen  7.5-325 MG tablet Commonly known as:  NORCO Replaced by:  HYDROcodone-acetaminophen 10-325 MG tablet     TAKE these medications   aspirin 81 MG tablet Take 1 tablet (81 mg total) by mouth 2 (two) times daily after a meal.   cefTRIAXone IVPB Commonly known as:  ROCEPHIN Inject 2 g into the vein daily. Indication:  Left hip prosthetic joint infection Last Day of Therapy:  12/05/16 Labs - Once weekly:  CBC/D and BMP, Labs - Every other week:  ESR and CRP   cetirizine 10 MG tablet Commonly known as:  ZYRTEC Take 10 mg by mouth daily.   docusate sodium 100 MG capsule Commonly known as:  COLACE Take 1 capsule (100 mg total) by mouth 2 (two) times daily.   escitalopram 20 MG tablet Commonly known as:  LEXAPRO Take 20 mg by mouth daily.   gabapentin 300 MG capsule Commonly known as:  NEURONTIN Take 300 mg by mouth 2 (two) times daily.   HYDROcodone-acetaminophen 10-325 MG tablet Commonly known as:  NORCO Take 1-2 tablets by mouth every 6 (six) hours as needed for moderate pain ((score 4 to 6)). Replaces:  HYDROcodone-acetaminophen 7.5-325 MG tablet   methocarbamol 750 MG tablet Commonly known as:  ROBAXIN Take 750 mg by mouth 4 (four) times daily.   ondansetron 4 MG tablet Commonly known as:  ZOFRAN Take 1 tablet (4 mg total) by mouth every 6 (six) hours as needed for nausea.   senna 8.6 MG Tabs tablet Commonly known as:  SENOKOT Take 1 tablet (8.6 mg total) by mouth 2 (two) times daily.   vancomycin 1,500 mg in sodium chloride 0.9 % 500 mL Inject 1,500 mg into the  vein every 12 (twelve) hours.   vancomycin IVPB Inject 1,500 mg into the vein every 12 (twelve) hours. Indication:  Prosthetic Hip Infection  Last Day of Therapy:  12/05/16 Labs - Sunday/Monday:  CBC/D, BMP, and vancomycin trough. Labs - Thursday:  BMP and vancomycin trough Labs - Every other week:  ESR and CRP            Home Infusion Instuctions        Start     Ordered   10/27/16 0000  Home infusion instructions [facility] May follow [facility] Dosing Protocol; May administer Cathflo as needed to maintain patency of vascular access device.; Flushing of vascular access device: per [facility] Protocol: 0.9% NaCl pre/post medica...    Question Answer Comment  Instructions May follow [facility] Dosing Protocol   Instructions May administer Cathflo as needed to maintain patency of vascular access device.   Instructions Flushing of vascular access device: per [facility] Protocol: 0.9% NaCl pre/post medication administration and prn patency; Heparin 100 u/ml, 5ml for implanted ports and Heparin 10u/ml, 5ml for all other central venous catheters.   Instructions May follow [facility] Anaphylaxis Protocol for First Dose Administration in the home: 0.9% NaCl at 25-50 ml/hr to maintain IV access for protocol meds. Epinephrine 0.3 ml IV/IM PRN and Benadryl 25-50 IV/IM PRN s/s of anaphylaxis.   Instructions [facility] Infusion Coordinator (RN) to assist per patient IV care needs in the home PRN.      10 /23/18 0855   10/26/16 0000  Home infusion instructions [facility] May follow [place] Dosing Protocol; May administer Cathflo as needed to maintain patency of vascular access device.; Flushing of vascular access device: per [facility] Protocol: 0.9% NaCl pre/post medica...    Question Answer Comment  Instructions May follow [facility]  Pharmacy Dosing Protocol   Instructions May administer Cathflo as needed to maintain patency of vascular access device.   Instructions Flushing of vascular access device:  per [facility] Protocol: 0.9% NaCl pre/post medication administration and prn patency; Heparin 100 u/ml, 97m for implanted ports and Heparin 10u/ml, 587mfor all other central venous catheters.   Instructions May follow [facility] Anaphylaxis Protocol for First Dose Administration in the home: 0.9% NaCl at 25-50 ml/hr to maintain IV access for protocol meds. Epinephrine 0.3 ml IV/IM PRN and Benadryl 25-50 IV/IM PRN s/s of anaphylaxis.   Instructions [facility] Infusion Coordinator (RN) to assist per patient IV care needs in the home PRN.      10/26/16 1604      Diagnostic Studies: Dg Pelvis Portable  Result Date: 10/23/2016 CLINICAL DATA:  [age]ear old male status post left hip replacement. EXAM: PORTABLE PELVIS 1-2 VIEWS COMPARISON:  Prior radiograph 12/23/2015 FINDINGS: Surgical changes of left hip arthroplasty are again noted. There is evidence of recent debridement with subcutaneous emphysema and the surgical drain in place. Additionally, there are very subtle lucencies in the acetabulum adjacent to the screw holes of the acetabular cup consistent with recent debridement. No evidence of periprosthetic fracture or hardware complication. The remaining pelvis is intact and unremarkable. IMPRESSION: Postsurgical changes of incision, drainage, debridement and lavage of the left hip arthroplasty prosthesis. No evidence of immediate complication. Electronically Signed   By: HeJacqulynn Cadet.D.   On: 10/23/2016 11:26    Disposition: 01-Home or Self Care  Discharge Instructions    Call MD / Call 911    Complete by:  As directed    If you experience chest pain or shortness of breath, CALL 911 and be transported to the hospital emergency room.  If you develope a fever above 101 F, pus (white drainage) or increased drainage or redness at the wound, or calf pain, call your surgeon's office.   Constipation Prevention    Complete by:  As directed    Drink plenty of fluids.  Prune juice may be helpful.  You may  use a stool softener, such as Colace (over the counter) 100 mg twice a day.  Use MiraLax (over the counter) for constipation as needed.   Diet - low sodium heart healthy    Complete by:  As directed    Discharge instructions    Complete by:  As directed    Keep VAC dressing clean and dry. Do not remove. Charge VAC unit nightly.   Driving restrictions    Complete by:  As directed    No driving for 6 weeks   Home infusion instructions [place]; May administer Cathflo as needed to maintain patency of vascular access device.; Flushing of vascular access device: [facility]: 0.9% NaCl pre/post medica...    Complete by:  As directed    Instructions:  [place] [facility] [place]   Instructions:  May administer Cathflo as needed to maintain patency of vascular access device.   Instructions:  Flushing [facility] vascular access [facility]: 0.9% NaCl pre/post medication administration and prn patency; Heparin 100 u/ml, 21m38mor implanted ports and Heparin 10u/ml, 21ml64mr all other central venous catheters.   Instructions:  May follow [facility] Anaphylaxis Protocol for First Dose Administration in the home: 0.9% NaCl at 25-50 ml/hr to maintain IV access for protocol meds. Epinephrine 0.3 ml IV/IM PRN and Benadryl 25-50 IV/IM PRN s/s of anaphylaxis.  Instructions:  [place] Infusion Coordinator (RN) to assist per patient IV care needs in the home PRN.   Home infusion instructions [facility] May follow [place] Dosing Protocol; May administer Cathflo as needed to maintain patency of vascular access device.; Flushing of vascular access device: per [facility] Protocol: 0.9% NaCl pre/post medica...    Complete by:  As directed    Instructions:  May follow [place] Dosing Protocol   Instructions:  May administer Cathflo as needed to maintain patency of vascular access device.   Instructions:  Flushing of vascular access device:  per [facility] Protocol: 0.9% NaCl pre/post medication administration and prn patency; Heparin 100 u/ml, 25m for implanted ports and Heparin 10u/ml, 520mfor all other central venous catheters.   Instructions:  May follow [facility] Anaphylaxis Protocol for First Dose Administration in the home: 0.9% NaCl at 25-50 ml/hr to maintain IV access for protocol meds. Epinephrine 0.3 ml IV/IM PRN and Benadryl 25-50 IV/IM PRN s/s of anaphylaxis.   Instructions:  [facility] Coordinator (RN) to assist per patient IV care needs in the home PRN.   Increase activity slowly as tolerated    Complete by:  As directed    Lifting restrictions    Complete by:  As directed    No lifting for 6 weeks   TED hose    Complete by:  As directed    Use stockings (TED hose) for 2 weeks on both leg(s).  You may remove them at night for sleeping.      Follow-up Information    Turner Baillie, BrAaron EdelmanMD. Schedule an appointment as soon as possible for a visit on 11/16/2016.   Specialty:  Orthopedic Surgery Why:  VAC removal Contact information: [phone]          Signed: SwElie Goody0/24/2018, 1:13 PM

## 2016-10-26 NOTE — Progress Notes (Signed)
Nutrition Follow-up  DOCUMENTATION CODES:   Not applicable  INTERVENTION:  Continue Ensure Enlive po BID, each supplement provides 350 kcal and 20 grams of protein.  Encourage adequate PO intake.   NUTRITION DIAGNOSIS:   Increased nutrient needs related to wound healing as evidenced by estimated needs; ongoing  GOAL:   Patient will meet greater than or equal to 90% of their needs; met  MONITOR:   PO intake, Supplement acceptance, Labs, Weight trends, Skin, I & O's  REASON FOR ASSESSMENT:   Malnutrition Screening Tool    ASSESSMENT:   56 y.o. male admitted on 10/23/16 with postoperative hip infection (from [place] in 12/2015), now s/p I&D with placement of wound vac and suction drain. Pertinent PMH includes PTSD, GERD, depression, anxiety, arthritis.  Meal completion has been 100%. Pt reports having a good appetite currently with no other difficulties. Pt reports eating well at home with at least 2 meals a day. Pt currently has Ensure ordered and has been consuming them. Pt educated on the importance of adequate protein intake on wound healing. Pt reports understanding.   Nutrition-Focused physical exam completed. Findings are no fat depletion, mild muscle depletion, and mild edema.   Labs and medications reviewed.   Diet Order:  Diet regular Room service appropriate? Yes; Fluid consistency: Thin  Skin:   (Incision L hip)  Last BM:  10/21  Height:   Ht Readings from Last 1 Encounters:  10/23/16 _0  (1.803 m)    Weight:   Wt Readings from Last 1 Encounters:  10/23/16 185 lb (83.9 kg)    Ideal Body Weight:  78 kg  BMI:  Body mass index is 25.8 kg/m.  Estimated Nutritional Needs:   Kcal:  2200-2400  Protein:  110-120 grams  Fluid:  2.2 -2.4 L/day  EDUCATION NEEDS:   No education needs identified at this time  Corrin Parker, MS, RD, LDN Pager # [phone] After hours/ weekend pager # [phone]

## 2016-10-27 LAB — VANCOMYCIN, TROUGH: Vancomycin Tr: 30 ug/mL (ref 15–20)

## 2016-10-27 MED ORDER — VANCOMYCIN IV (FOR PTA / DISCHARGE USE ONLY): 1500.0000 mg | Freq: Two times a day (BID) | INTRAVENOUS | 0 refills | Status: DC

## 2016-10-27 MED ORDER — VANCOMYCIN IV (FOR PTA / DISCHARGE USE ONLY): 1500.0000 mg | Freq: Two times a day (BID) | INTRAVENOUS | 0 refills | Status: AC

## 2016-10-27 MED ORDER — VANCOMYCIN HCL 10 G IV SOLR: 1500.0000 mg | Freq: Two times a day (BID) | INTRAVENOUS | 1 refills | Status: DC

## 2016-10-27 MED ORDER — VANCOMYCIN HCL 10 G IV SOLR
1500.0000 mg | Freq: Two times a day (BID) | INTRAVENOUS | Status: DC
  Administered 2016-10-27 (×2): 1500 mg via INTRAVENOUS
  Filled 2016-10-27 (×3): qty 1500

## 2016-10-27 MED ORDER — CEFTRIAXONE IV (FOR PTA / DISCHARGE USE ONLY): 2.0000 g | INTRAVENOUS | 0 refills | Status: DC

## 2016-10-27 NOTE — Progress Notes (Signed)
   Subjective:  Patient reports pain as mild to moderate.  Denies N/V/CP/SOBV.  Objective:   VITALS:   Vitals:   10/26/16 0538 10/26/16 1250 10/26/16 2220 10/27/16 0447  BP: 102/63 (!) 95/53 (!) 97/58 (!) 99/59  Pulse: 81 76 71 81  Resp: 16 18 17    Temp: 98.3 F (36.8 C) 98.1 F (36.7 C) 98.5 F (36.9 C) 98.4 F (36.9 C)  TempSrc: Oral Oral Oral Oral  SpO2: 97% 99% 97% 95%  Weight:      Height:        NAD ABD soft Sensation intact distally Intact pulses distally Dorsiflexion/Plantar flexion intact Incision: dressing C/D/I Compartment soft Prevena intact   Lab Results  Component Value Date   WBC 7.4 10/26/2016   HGB 8.2 (L) 10/26/2016   HCT 24.3 (L) 10/26/2016   MCV 85.0 10/26/2016   PLT 159 10/26/2016   BMET    Component Value Date/Time   NA 136 10/26/2016 0442   K 3.8 10/26/2016 0442   CL 103 10/26/2016 0442   CO2 27 10/26/2016 0442   GLUCOSE 111 (H) 10/26/2016 0442   BUN <5 (L) 10/26/2016 0442   CREATININE 0.82 10/26/2016 0442   CALCIUM 7.7 (L) 10/26/2016 0442   GFRNONAA >60 10/26/2016 0442   GFRAA >60 10/26/2016 0442    Recent Results (from the past 240 hour(s))  Aerobic/Anaerobic Culture (surgical/deep wound)     Status: None (Preliminary result)   Collection Time: 10/23/16  8:13 AM  Result Value Ref Range Status   Specimen Description WOUND LEFT HIP  Final   Special Requests SWAB SPEC A  Final   Gram Stain   Final    MODERATE WBC PRESENT, PREDOMINANTLY PMN NO ORGANISMS SEEN    Culture   Final    RARE GROUP B STREP(S.AGALACTIAE)ISOLATED TESTING AGAINST S. AGALACTIAE NOT ROUTINELY PERFORMED DUE TO PREDICTABILITY OF AMP/PEN/VAN SUSCEPTIBILITY. CRITICAL RESULT CALLED TO, READ BACK BY AND VERIFIED WITH: C SNIDER,MD AT 1444 10/26/16 BY L BENFIELD CONCERNING GROWTH ON CULTURE    Report Status PENDING  Incomplete  Aerobic Culture (superficial specimen)     Status: None   Collection Time: 10/23/16  8:17 AM  Result Value Ref Range Status   Specimen Description TISSUE LEFT HIP PSEUDO CAPSULE  Final   Special Requests SPEC B  Final   Gram Stain   Final    MODERATE WBC PRESENT,BOTH PMN AND MONONUCLEAR NO ORGANISMS SEEN    Culture NO GROWTH 2 DAYS  Final   Report Status 10/26/2016 FINAL  Final     Assessment/Plan: 4 Days Post-Op   Principal Problem:   Infection of total joint prosthesis (HCC)   WBAT with walker DVT ppx: lovenox, SCDs, TEDs PO pain control ABLA: asymptomatic, monitor Cont Prevena ID: PICC placed, appreciate recs Cont IV abx Dispo: d/c home today on IV vanco and ceftriaxone   Amayra Kiedrowski, Cloyde ReamsBrian James 10/27/2016, 1:11 PM   Samson FredericBrian Shayden Gingrich, MD Cell [phone]

## 2016-10-27 NOTE — Care Management Note (Signed)
Case Management Note  Patient Details  Name: Zachary Watson C Fischel MRN: [id number] Date of Birth: 08/17/1960  Subjective/Objective: 56 yr old gentleman s/p I &D of left hip replacement. Patient had wound Vac placed.                    Action/Plan: Case manager has spoken with patient concerning discharge plan.  Patient will need 6 weeks of IV antibiotics. PICC line has been placed. CM [facility] Maggie Chickie [phone], orders, PICC placement note, OP note, [facility] MD:[phone], [phone] [phone] or [phone]. Case manager has spoken with Vernona RiegerLaura with [facility], [phone], faxed PICC placement note to her at [phone].  [facility] [place] [facility] [place] [facility] [place] currently in clinic and has to review orders. Patient may have to remain over night to get 11pm dose of antibiotic and Home Health will begin in AM after [facility] author[facility]. Case Manager sent this information to Dr. Linna CapriceSwinteck.  Expected Discharge Date:  10/27/16               Expected Discharge Plan:  Home w Home Health Services  In-House Referral:  NA  Discharge planning Services  CM Consult  Post Acute Care Choice:  Home Health Choice offered to:  Patient  DME Arranged:    DME Agency:     HH Arranged:  RN HH Agency:   ([facility] )  Status of Service:  In process, will continue to follow  If discussed at Long Length of Stay Meetings, dates discussed:    Additional Comments:  [name], Reichen Hutzler [name], RN 10/27/2016, 3:28 PM

## 2016-10-27 NOTE — Progress Notes (Signed)
Pharmacy Antibiotic Note  Zachary Watson is a 56 y.o. male admitted on 10/23/2016 with prosthetic joint infection of left hip s/p I&D.  He had left THA 12/23/15. Pharmacy has been consulted for vancomycin dosing and he is also on Rocephin.  Patient's renal function has been stable.  Vancomycin doses were given off schedule yesterday 10/26/16 and vancomycin trough today was obtained 5 hours after the previous dose.  Reported vancomycin trough is 30 mcg/mL is falsely elevated.  True trough is estimated around 24 mcg/mL, still supra-therapeutic.    Given plan to discharge patient home on vancomycin and Rocephin for 6 weeks, I will change patient to a Q12H regimen for safety and ease of administration in the outpatient setting.   Plan: Change vanc to 1500mg  IV Q12H CTX 2gm IV Q24H per MD Monitor renal fxn, clinical progress, vanc trough at new Css   Height: 5\' 11"  (180.3 cm) Weight: 185 lb (83.9 kg) IBW/kg (Calculated) : 75.3  Temp (24hrs), Avg:98.3 F (36.8 C), Min:98.1 F (36.7 C), Max:98.5 F (36.9 C)   Recent Labs Lab 10/23/16 0644 10/24/16 0557 10/25/16 0406 10/25/16 0845 10/26/16 0442 10/27/16 0702  WBC 7.5 7.1 7.2  --  7.4  --   CREATININE 0.79 0.96 0.89  --  0.82  --   VANCOTROUGH  --   --   --  11*  --  30*    Estimated Creatinine Clearance: 107.1 mL/min (by C-G formula based on SCr of 0.82 mg/dL).    No Known Allergies   Doxy PTA Vanc 10/19 >> (12/1) Cefazolin 10/19 >> 10/22 CTX 10/22 >> (12/1)  10/21 VT = 11 (true VT ~11.7) on 1g q8 >> incr 1250mg  q8 10/23 VT = 30 (true VT ~24) on 1250mg  q8 >> change to 1750mg  q12  10/19 left hip tissue - Group B Strep   Zachary Watson D. [name], PharmD, BCPS Pager:  [phone] 10/27/2016, 8:35 AM

## 2016-10-27 NOTE — Progress Notes (Signed)
CRITICAL VALUE ALERT  Critical Value:  Vancomycin Trough: 30  Date & Time Notied:  10/27/16 at 0813  Provider Notified: Dr. Linna CapriceSwinteck and Chelsea Aushuy Dang-RPH   Orders Received/Actions taken: AM dose discontinued and rescheduled.

## 2016-10-27 NOTE — Progress Notes (Signed)
Physical Therapy Treatment Patient Details Name: Zachary Watson MRN: [id number] DOB: 1960/12/30 Today's Date: 10/27/2016    History of Present Illness Pt is a 56 y.o. male admitted on 10/23/16 with postoperative hip infection (from [facility] in 12/2015), now s/p I&D with placement of wound vac and suction drain. Pertinent PMH includes PTSD, GERD, depression, anxiety, arthritis, chronic LBP.    PT Comments    Pt has progressed well with mobility. Mod indep for transfers and amb 400'. Reviewed therex, positioning, fall risk reduction, and importance of continued mobility. Pt has met short-term acute PT goals. All questions answered and education completed. From a mobility perspective, feel pt is safe to d/c home with intermittent supervision. D/c acute PT.   Follow Up Recommendations  No PT follow up;Supervision - Intermittent     Equipment Recommendations  None recommended by PT    Recommendations for Other Services       Precautions / Restrictions Precautions Precautions: None Restrictions Weight Bearing Restrictions: Yes LLE Weight Bearing: Weight bearing as tolerated Other Position/Activity Restrictions: Wound vac    Mobility  Bed Mobility Overal bed mobility: Independent             General bed mobility comments: Pt up in chair upon arrival  Transfers Overall transfer level: Needs assistance Equipment used: None Transfers: Sit to/from Stand Sit to Stand: Independent            Ambulation/Gait Ambulation/Gait assistance: Modified independent (Device/Increase time) Ambulation Distance (Feet): 400 Feet Assistive device: 1 person hand held assist Gait Pattern/deviations: Step-through pattern;Decreased stride length;Decreased weight shift to left;Antalgic Gait velocity: Decreased   General Gait Details: Amb mod indep with single UE support pushing IV pole; pt with good awareness of lines/leads and able to navigate these indep. Able to amb in room with no AD and  supervision for safety   Stairs            Wheelchair Mobility    Modified Rankin (Stroke Patients Only)       Balance Overall balance assessment: Needs assistance Sitting-balance support: No upper extremity supported;Feet supported;Feet unsupported Sitting balance-Leahy Scale: Good     Standing balance support: No upper extremity supported;During functional activity;Bilateral upper extremity supported Standing balance-Leahy Scale: Fair Standing balance comment: Able to static stand with no UE support and supervision                             Cognition Arousal/Alertness: Awake/alert Behavior During Therapy: WFL for tasks assessed/performed Overall Cognitive Status: Within Functional Limits for tasks assessed                                        Exercises      General Comments General comments (skin integrity, edema, etc.): Girlfriend present during session      Pertinent Vitals/Pain Pain Assessment: Faces Faces Pain Scale: Hurts a little bit Pain Location: L hip Pain Descriptors / Indicators: Sore;Tightness Pain Intervention(s): Monitored during session    Home Living                      Prior Function            PT Goals (current goals can now be found in the care plan section) Acute Rehab PT Goals Patient Stated Goal: Return home PT Goal Formulation: With patient Time For  Goal Achievement: 11/06/16 Potential to Achieve Goals: Good Progress towards PT goals: Goals met/education completed, patient discharged from PT    Frequency    Min 5X/week      PT Plan Current plan remains appropriate    Co-evaluation              AM-PAC PT "6 Clicks" Daily Activity  Outcome Measure  Difficulty turning over in bed (including adjusting bedclothes, sheets and blankets)?: None Difficulty moving from lying on back to sitting on the side of the bed? : None Difficulty sitting down on and standing up from a  chair with arms (e.g., wheelchair, bedside commode, etc,.)?: None Help needed moving to and from a bed to chair (including a wheelchair)?: None Help needed walking in hospital room?: None Help needed climbing 3-5 steps with a railing? : A Little 6 Click Score: 23    End of Session Equipment Utilized During Treatment: Gait belt Activity Tolerance: Patient tolerated treatment well Patient left: in chair;with call bell/phone within reach;with family/visitor present Nurse Communication: Mobility status PT Visit Diagnosis: Other abnormalities of gait and mobility (R26.89);Pain Pain - Right/Left: Left Pain - part of body: Hip     Time: 0915-0930 PT Time Calculation (min) (ACUTE ONLY): 15 min  Charges:  $Gait Training: 8-22 mins                    G Codes:      Mabeline Caras, PT, DPT Acute Rehab Services  Pager: [place] 10/27/2016, 9:36 AM

## 2016-10-27 NOTE — Progress Notes (Signed)
PHARMACY CONSULT NOTE FOR:  OUTPATIENT  PARENTERAL ANTIBIOTIC THERAPY (OPAT)  Indication: Left hip prosthetic joint infection  Regimen: Ceftriaxone 2 gm IV Q24H + Vancomycin 1500mg  IV Q12H End date: 12/05/16  IV antibiotic discharge orders are entered.   Thank you for allowing pharmacy to be a part of this patient's care.  Roman Sandall D. [name], PharmD, BCPS Pager:  [phone] 10/27/2016, 8:45 AM

## 2016-10-28 LAB — AEROBIC/ANAEROBIC CULTURE W GRAM STAIN (SURGICAL/DEEP WOUND)

## 2016-10-28 LAB — AEROBIC/ANAEROBIC CULTURE (SURGICAL/DEEP WOUND)

## 2016-10-28 MED ORDER — HEPARIN SOD (PORK) LOCK FLUSH 100 UNIT/ML IV SOLN
250.0000 [IU] | INTRAVENOUS | Status: AC | PRN
  Administered 2016-10-28: 250 [IU]

## 2016-11-02 NOTE — Anesthesia Postprocedure Evaluation (Signed)
Anesthesia Post Note  Patient: Zachary Watson  Procedure(s) Performed: IRRIGATION AND DEBRIDEMENT LEFT HIP WITH HEAD AND POLY EXCHANGE (Left Hip) APPLICATION OF WOUND VAC (Left Hip)     Patient location during evaluation: PACU Anesthesia Type: General Level of consciousness: awake and alert Pain management: pain level controlled Vital Signs Assessment: post-procedure vital signs reviewed and stable Respiratory status: spontaneous breathing, nonlabored ventilation, respiratory function stable and patient connected to nasal cannula oxygen Cardiovascular status: blood pressure returned to baseline and stable Postop Assessment: no apparent nausea or vomiting Anesthetic complications: no    Last Vitals:  Vitals:   10/27/16 1908 10/28/16 0614  BP: 107/62 114/68  Pulse: 72 86  Resp: 18 20  Temp: 36.9 C 37 C  SpO2: 96% 100%    Last Pain:  Vitals:   10/28/16 0614  TempSrc: Oral  PainSc:                  Shenita Trego S

## 2016-12-03 ENCOUNTER — Encounter: Payer: Self-pay | Admitting: Internal Medicine

## 2016-12-03 ENCOUNTER — Ambulatory Visit (INDEPENDENT_AMBULATORY_CARE_PROVIDER_SITE_OTHER): Payer: Non-veteran care | Admitting: Internal Medicine

## 2016-12-03 DIAGNOSIS — Z5181 Encounter for therapeutic drug level monitoring: Secondary | ICD-10-CM | POA: Diagnosis not present

## 2016-12-03 DIAGNOSIS — Z7189 Other specified counseling: Secondary | ICD-10-CM | POA: Diagnosis not present

## 2016-12-03 DIAGNOSIS — T8450XD Infection and inflammatory reaction due to unspecified internal joint prosthesis, subsequent encounter: Secondary | ICD-10-CM | POA: Diagnosis not present

## 2016-12-03 DIAGNOSIS — Z7185 Encounter for immunization safety counseling: Secondary | ICD-10-CM

## 2016-12-03 MED ORDER — AMOXICILLIN 500 MG PO CAPS: 500.0000 mg | ORAL_CAPSULE | Freq: Three times a day (TID) | ORAL | 5 refills | Status: DC

## 2016-12-03 NOTE — Progress Notes (Signed)
   Subjective:    Patient ID: Zachary Watson C Kuch, male    DOB: 05/11/1960, 56 y.o.   MRN: [id number]  HPI Here for hospital follow up. He has a history of left THA in [place] 2017 and developed infection with GBS and underwent I and D with polyexchange on 10/23/16 by Dr. Linna CapriceSwinteck.  He had been on doxycycline due to warmth and pain in the hip but did not resolve.  He was started on vancomycin and cefepime and changed to vancomycin and ceftiaxone for 6 weeks through December 1.  He has done well with no issues with picc line, antibiotics.  No associated n/v/d.  No rashes though has noted some dry skin.     Review of Systems  Constitutional: Negative for fatigue.  Gastrointestinal: Negative for diarrhea and nausea.  Skin: Negative for rash.       Objective:   Physical Exam  Constitutional: He appears well-developed and well-nourished. No distress.  HENT:  Mouth/Throat: No oropharyngeal exudate.  Eyes: No scleral icterus.  Cardiovascular: Normal rate, regular rhythm and normal heart sounds.  No murmur heard. Pulmonary/Chest: Effort normal and breath sounds normal. No respiratory distress.  Skin: No rash noted.   SH: + tobacco       Assessment & Plan:

## 2016-12-03 NOTE — Assessment & Plan Note (Signed)
Continue to monitor this week.  Will check labs again next visit

## 2016-12-03 NOTE — Assessment & Plan Note (Signed)
Doing well, continue through 12/1 and stop. Will start amoxicillin for 3-6 months after.  rtc 6 weeks.

## 2016-12-03 NOTE — Assessment & Plan Note (Signed)
Counseled on the flu shot and he will go to the [facility] and get it

## 2017-01-14 ENCOUNTER — Ambulatory Visit: Payer: Non-veteran care | Admitting: Internal Medicine

## 2017-02-16 ENCOUNTER — Encounter: Payer: Self-pay | Admitting: Internal Medicine

## 2017-02-16 ENCOUNTER — Ambulatory Visit (INDEPENDENT_AMBULATORY_CARE_PROVIDER_SITE_OTHER): Payer: Non-veteran care | Admitting: Internal Medicine

## 2017-02-16 VITALS — BP 116/75 | HR 71 | Temp 98.2°F | Ht 71.0 in | Wt 195.0 lb

## 2017-02-16 DIAGNOSIS — T8450XD Infection and inflammatory reaction due to unspecified internal joint prosthesis, subsequent encounter: Secondary | ICD-10-CM | POA: Diagnosis not present

## 2017-02-16 DIAGNOSIS — Z5181 Encounter for therapeutic drug level monitoring: Secondary | ICD-10-CM | POA: Diagnosis not present

## 2017-02-17 LAB — COMPREHENSIVE METABOLIC PANEL
AG Ratio: 1.4 (calc) (ref 1.0–2.5)
ALKALINE PHOSPHATASE (APISO): 82 U/L (ref 40–115)
ALT: 20 U/L (ref 9–46)
AST: 22 U/L (ref 10–35)
Albumin: 4.3 g/dL (ref 3.6–5.1)
BILIRUBIN TOTAL: 0.6 mg/dL (ref 0.2–1.2)
BUN: 8 mg/dL (ref 7–25)
CO2: 28 mmol/L (ref 20–32)
Calcium: 9.5 mg/dL (ref 8.6–10.3)
Chloride: 106 mmol/L (ref 98–110)
Creat: 1.01 mg/dL (ref 0.70–1.33)
Globulin: 3 g/dL (calc) (ref 1.9–3.7)
Glucose, Bld: 101 mg/dL — ABNORMAL HIGH (ref 65–99)
Potassium: 4.8 mmol/L (ref 3.5–5.3)
Sodium: 138 mmol/L (ref 135–146)
TOTAL PROTEIN: 7.3 g/dL (ref 6.1–8.1)

## 2017-02-17 LAB — CBC WITH DIFFERENTIAL/PLATELET
BASOS ABS: 49 {cells}/uL (ref 0–200)
Basophils Relative: 0.8 %
EOS ABS: 226 {cells}/uL (ref 15–500)
Eosinophils Relative: 3.7 %
HEMATOCRIT: 43.3 % (ref 38.5–50.0)
Hemoglobin: 14.6 g/dL (ref 13.2–17.1)
LYMPHS ABS: 1458 {cells}/uL (ref 850–3900)
MCH: 27.4 pg (ref 27.0–33.0)
MCHC: 33.7 g/dL (ref 32.0–36.0)
MCV: 81.4 fL (ref 80.0–100.0)
MPV: 10.2 fL (ref 7.5–12.5)
Monocytes Relative: 8.1 %
NEUTROS PCT: 63.5 %
Neutro Abs: 3874 cells/uL (ref 1500–7800)
Platelets: 209 10*3/uL (ref 140–400)
RBC: 5.32 10*6/uL (ref 4.20–5.80)
RDW: 16.7 % — AB (ref 11.0–15.0)
Total Lymphocyte: 23.9 %
WBC mixed population: 494 cells/uL (ref 200–950)
WBC: 6.1 10*3/uL (ref 3.8–10.8)

## 2017-02-17 LAB — SEDIMENTATION RATE: SED RATE: 11 mm/h (ref 0–20)

## 2017-02-17 LAB — C-REACTIVE PROTEIN: CRP: 11.7 mg/L — AB (ref <8.0)

## 2017-02-17 NOTE — Assessment & Plan Note (Addendum)
Doing well with continued improvement.   Will check CRP and ESR today Will continue amoxicillin a total of about 5-6 months, unless concerns on labs.  He will rtc in about 2 months and will consider stopping at that time.

## 2017-02-17 NOTE — Assessment & Plan Note (Signed)
Will check cmp and cbc today.

## 2017-02-17 NOTE — Progress Notes (Signed)
   Subjective:    Patient ID: Zachary Watson, male    DOB: 02/24/1960, 57 y.o.   MRN: [id number]  HPI Here for hospital follow up. He has a history of left THA in [place] 2017 and developed infection with GBS and underwent I and D with polyexchange on 10/23/16 by Dr. Linna CapriceSwinteck.  He was put on vancomycin and ceftiaxone for 6 weeks through December 1 and tolerated well. After treatment completion he started on amoxicillin based on previous culture of GBS and is tolerating well.  He has had no increase in pain, no swelling.     Review of Systems  Constitutional: Negative for fatigue.  Gastrointestinal: Negative for diarrhea and nausea.  Skin: Negative for rash.       Objective:   Physical Exam  Constitutional: He appears well-developed and well-nourished. No distress.  HENT:  Mouth/Throat: No oropharyngeal exudate.  Eyes: No scleral icterus.  Cardiovascular: Normal rate, regular rhythm and normal heart sounds.  No murmur heard. Pulmonary/Chest: Effort normal and breath sounds normal. No respiratory distress.  Skin: No rash noted.   SH: + tobacco       Assessment & Plan:

## 2017-05-04 ENCOUNTER — Telehealth: Payer: Self-pay | Admitting: *Deleted

## 2017-05-04 ENCOUNTER — Encounter: Payer: Self-pay | Admitting: Internal Medicine

## 2017-05-04 ENCOUNTER — Ambulatory Visit (INDEPENDENT_AMBULATORY_CARE_PROVIDER_SITE_OTHER): Payer: Non-veteran care | Admitting: Internal Medicine

## 2017-05-04 DIAGNOSIS — T8450XD Infection and inflammatory reaction due to unspecified internal joint prosthesis, subsequent encounter: Secondary | ICD-10-CM | POA: Diagnosis not present

## 2017-05-04 DIAGNOSIS — M1612 Unilateral primary osteoarthritis, left hip: Secondary | ICD-10-CM

## 2017-05-04 NOTE — Assessment & Plan Note (Signed)
Doing well and I anticipate he is cured.  He will finish his antibiotics that he has and stop.  He will follow up as needed.  He knows to call here or see Dr. Linna Caprice if he develops any increased pain or swelling.

## 2017-05-04 NOTE — Assessment & Plan Note (Signed)
He will continue with his rehab at home.

## 2017-05-04 NOTE — Progress Notes (Signed)
Subjective:    Patient ID: Zachary Watson, male    DOB: 09-May-1960, 57 y.o.   MRN: [id number]  HPI Here for hospital follow up. He has a history of left THA in [place] 2017 and developed infection with GBS and underwent I and D with polyexchange on 10/23/16 by Dr. Lyla Glassing.  He was put on vancomycin and ceftiaxone for 6 weeks through December 1 and tolerated well. After treatment completion he started on amoxicillin based on previous culture of GBS and is tolerating well.  He has had no increase in pain, no swelling.  Last ESR wnl and CRP with minimal elevation at 11.7.  He is feeling well and now doing rehab of his hip on his own using aquatic therapy.  Still taking amoxicillin.  No associated rash, diarrhea.     Review of Systems  Constitutional: Negative for fatigue.  Gastrointestinal: Negative for diarrhea and nausea.  Skin: Negative for rash.       Objective:   Physical Exam  Constitutional: He appears well-developed and well-nourished. No distress.  HENT:  Mouth/Throat: No oropharyngeal exudate.  Eyes: No scleral icterus.  Cardiovascular: Normal rate, regular rhythm and normal heart sounds.  No murmur heard. Skin: No rash noted.          Assessment & Plan:

## 2017-05-04 NOTE — Telephone Encounter (Signed)
Patient brought lab bill from his office visit 02/2017.  His insurance did not pay for any of the balance.  He would like to know if this can be resubmitted/authorized with insurance. RN made copy of the bill, gave it to [employer].  RN will call for authorization if this is necessary. Andree Coss, RN

## 2017-06-11 LAB — COMPREHENSIVE METABOLIC PANEL
AG Ratio: 1.4 (calc) (ref 1.0–2.5)
ALT: 20 U/L (ref 9–46)
AST: 22 U/L (ref 10–35)
Albumin: 4.3 g/dL (ref 3.6–5.1)
Alkaline phosphatase (APISO): 82 U/L (ref 40–115)
BUN: 8 mg/dL (ref 7–25)
CHLORIDE: 106 mmol/L (ref 98–110)
CO2: 28 mmol/L (ref 20–32)
Calcium: 9.5 mg/dL (ref 8.6–10.3)
Creat: 1.01 mg/dL (ref 0.70–1.33)
Globulin: 3 g/dL (calc) (ref 1.9–3.7)
Glucose, Bld: 101 mg/dL — ABNORMAL HIGH (ref 65–99)
POTASSIUM: 4.8 mmol/L (ref 3.5–5.3)
SODIUM: 138 mmol/L (ref 135–146)
Total Bilirubin: 0.6 mg/dL (ref 0.2–1.2)
Total Protein: 7.3 g/dL (ref 6.1–8.1)

## 2017-06-11 LAB — CBC WITH DIFFERENTIAL/PLATELET
Basophils Absolute: 49 cells/uL (ref 0–200)
Basophils Relative: 0.8 %
Eosinophils Absolute: 226 cells/uL (ref 15–500)
Eosinophils Relative: 3.7 %
HCT: 43.3 % (ref 38.5–50.0)
Hemoglobin: 14.6 g/dL (ref 13.2–17.1)
Lymphs Abs: 1458 cells/uL (ref 850–3900)
MCH: 27.4 pg (ref 27.0–33.0)
MCHC: 33.7 g/dL (ref 32.0–36.0)
MCV: 81.4 fL (ref 80.0–100.0)
MPV: 10.2 fL (ref 7.5–12.5)
Monocytes Relative: 8.1 %
Neutro Abs: 3874 cells/uL (ref 1500–7800)
Neutrophils Relative %: 63.5 %
Platelets: 209 10*3/uL (ref 140–400)
RBC: 5.32 10*6/uL (ref 4.20–5.80)
RDW: 16.7 % — ABNORMAL HIGH (ref 11.0–15.0)
Total Lymphocyte: 23.9 %
WBC mixed population: 494 cells/uL (ref 200–950)
WBC: 6.1 10*3/uL (ref 3.8–10.8)

## 2017-06-11 LAB — C-REACTIVE PROTEIN: CRP: 11.7 mg/L — ABNORMAL HIGH (ref <8.0)

## 2017-06-11 LAB — SEDIMENTATION RATE: Sed Rate: 11 mm/h (ref 0–20)

## 2018-01-21 IMAGING — CR DG PORTABLE PELVIS
1 series · 1 of 1 positions shown · non-contrast
Comparison: Intraoperative radiographs performed earlier.

CLINICAL DATA: LEFT hip replacement.

EXAM:
PORTABLE PELVIS 1-2 VIEWS

[AP]
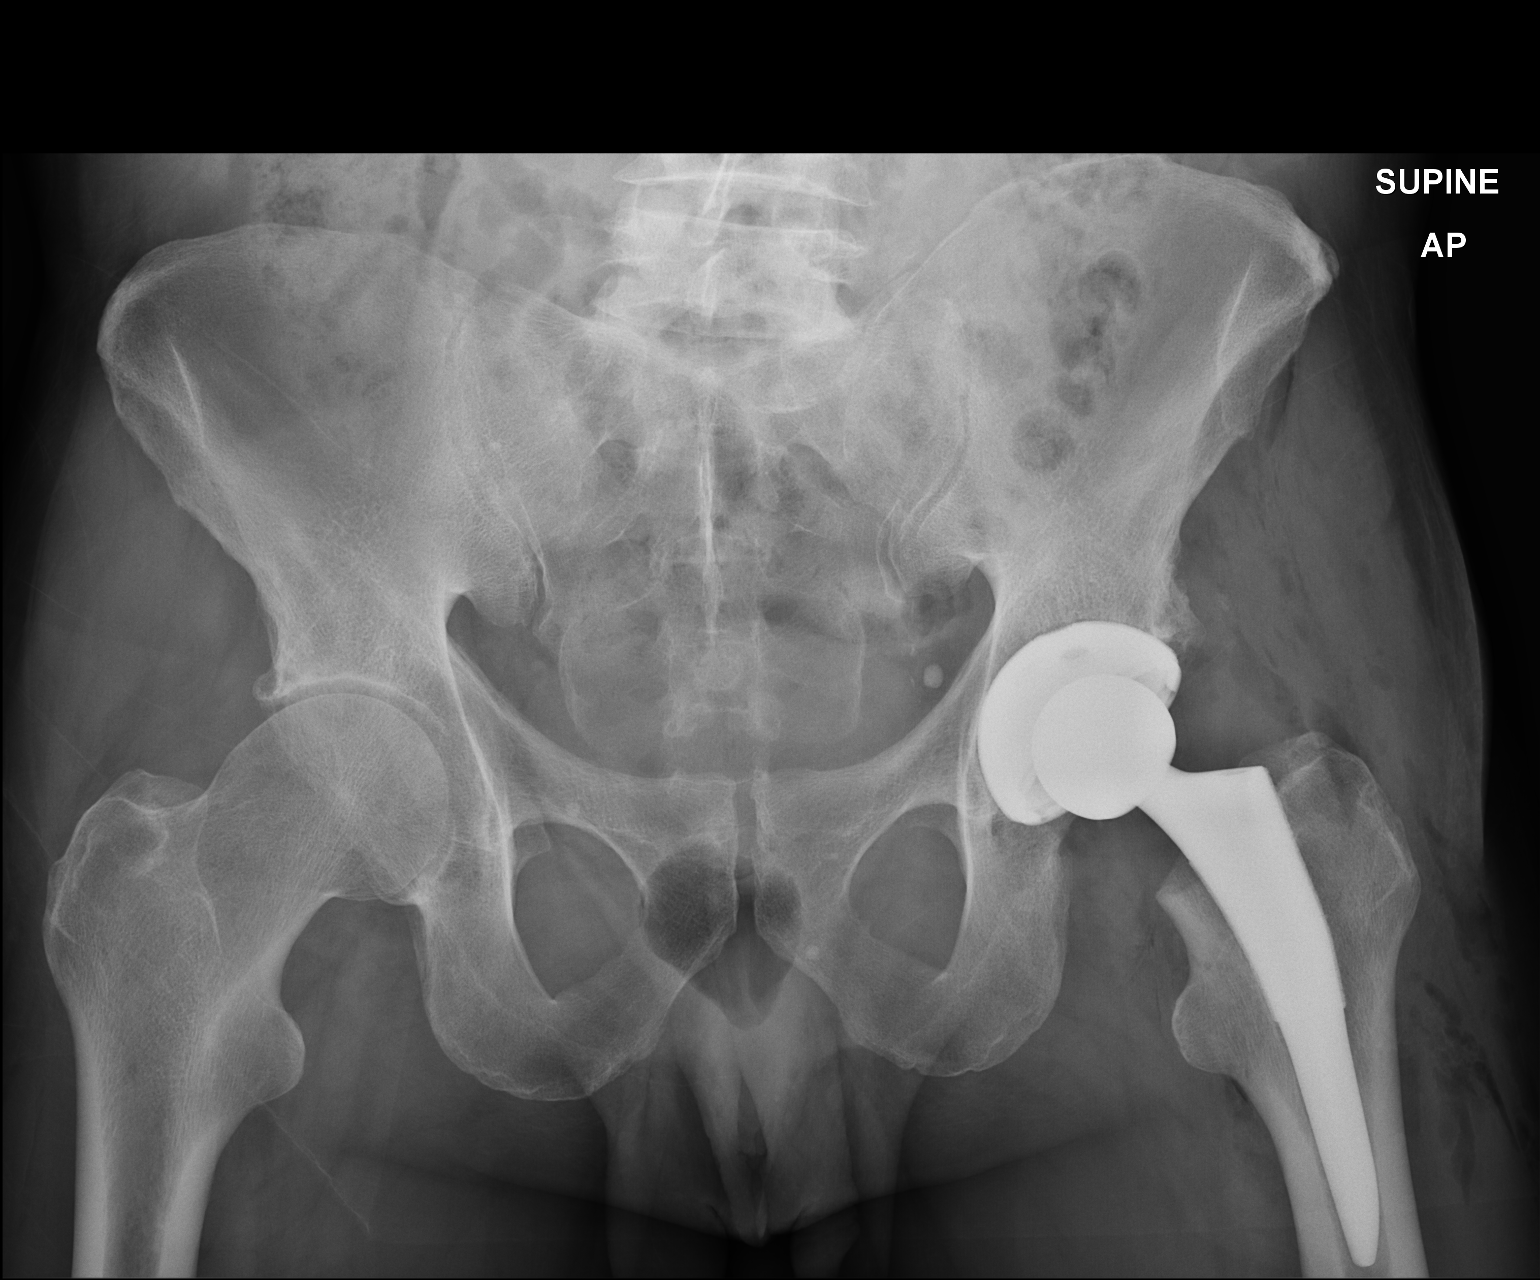

[1 of 1 positions shown; findings below may reference images not displayed]

FINDINGS: There has been LEFT total hip arthroplasty. Satisfactory position
and alignment.
IMPRESSION: No adverse features.

## 2018-11-22 IMAGING — CR DG PORTABLE PELVIS
2 series · 2 of 2 positions shown · non-contrast
Comparison: Prior radiograph 12/23/2015

CLINICAL DATA: 56-year-old male status post left hip replacement.

EXAM:
PORTABLE PELVIS 1-2 VIEWS

[AP (1 of 2)]
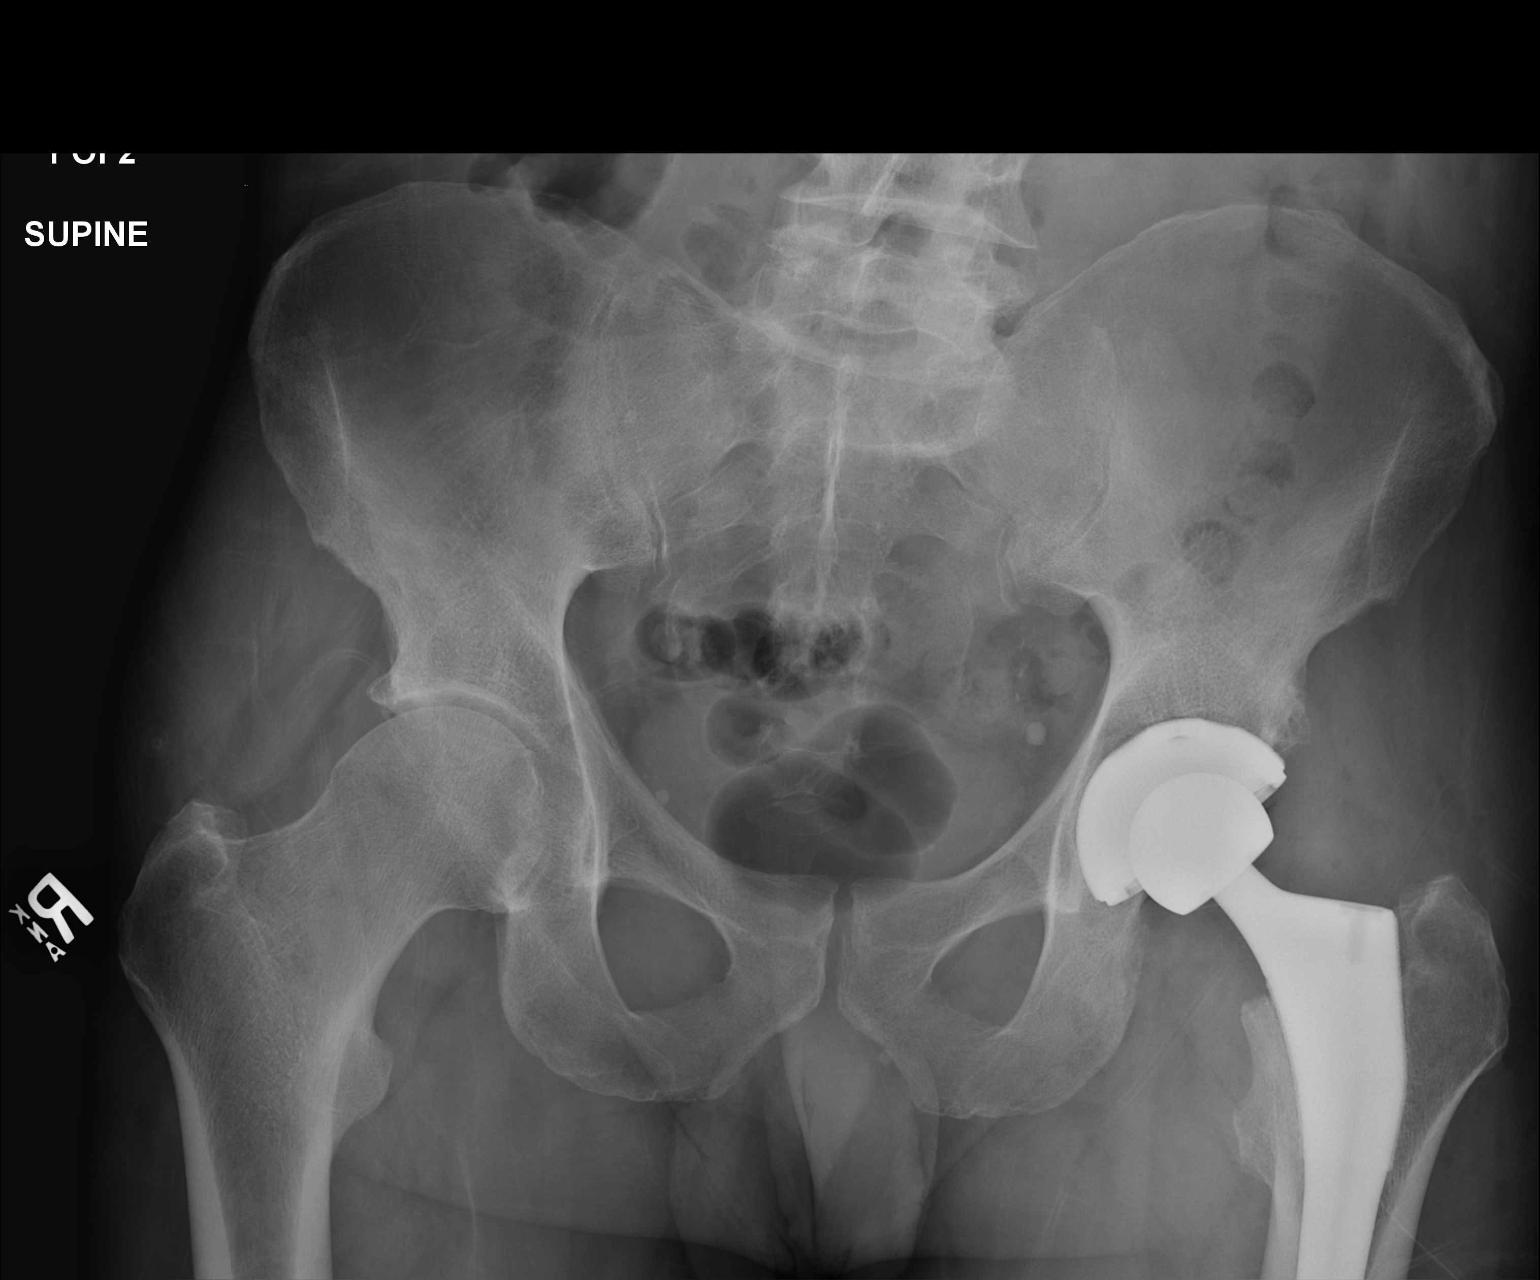

[AP (2 of 2)]
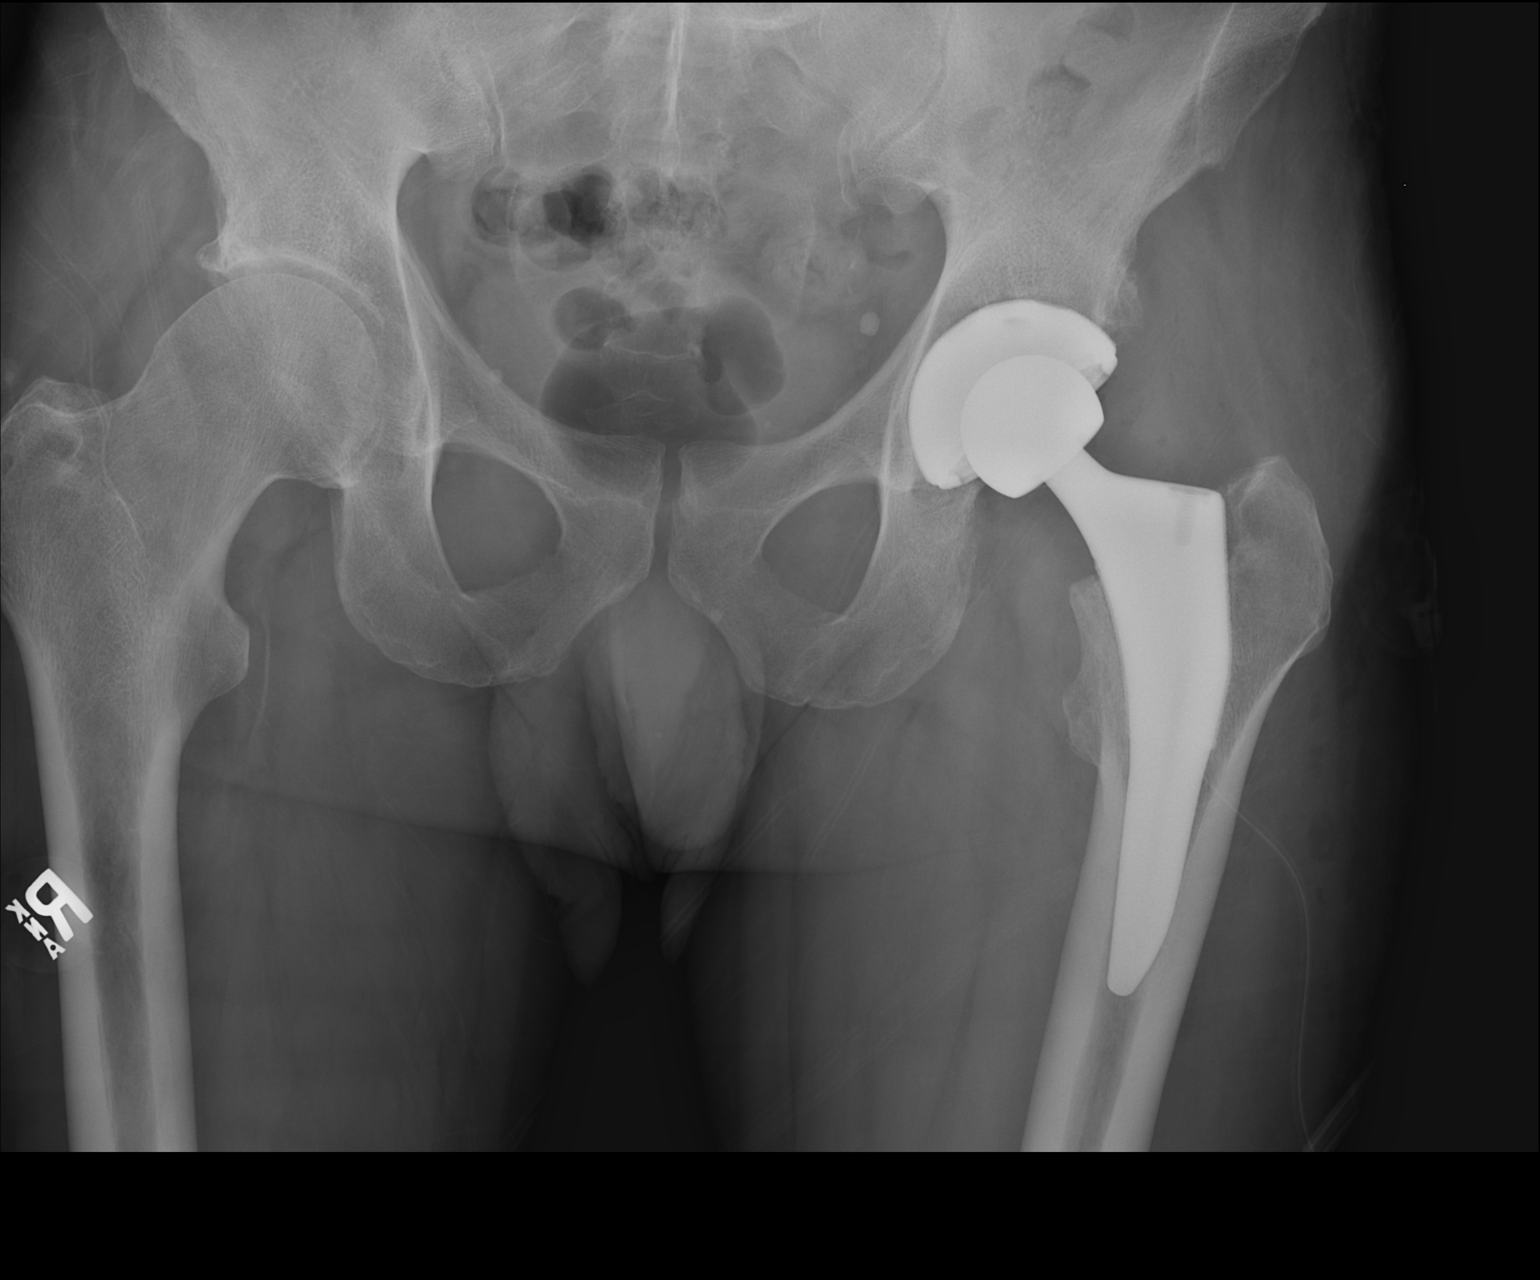

[2 of 2 positions shown; findings below may reference images not displayed]

FINDINGS: Surgical changes of left hip arthroplasty are again noted. There is
evidence of recent debridement with subcutaneous emphysema and the
surgical drain in place. Additionally, there are very subtle
lucencies in the acetabulum adjacent to the screw holes of the
acetabular cup consistent with recent debridement. No evidence of
periprosthetic fracture or hardware complication. The remaining
pelvis is intact and unremarkable.
IMPRESSION: Postsurgical changes of incision, drainage, debridement and lavage
of the left hip arthroplasty prosthesis. No evidence of immediate
complication.
